# Patient Record
Sex: Female | Born: 1980 | Race: White | Hispanic: No | Marital: Married | Smoking: Current every day smoker
Health system: Southern US, Community
[De-identification: ages and names within clinical notes are randomized; demographics above are authoritative.]

## PROBLEM LIST (undated history)

## (undated) DIAGNOSIS — F419 Anxiety disorder, unspecified: Secondary | ICD-10-CM

## (undated) DIAGNOSIS — Z8619 Personal history of other infectious and parasitic diseases: Secondary | ICD-10-CM

## (undated) DIAGNOSIS — J302 Other seasonal allergic rhinitis: Secondary | ICD-10-CM

## (undated) DIAGNOSIS — F32A Depression, unspecified: Secondary | ICD-10-CM

## (undated) DIAGNOSIS — F329 Major depressive disorder, single episode, unspecified: Secondary | ICD-10-CM

## (undated) HISTORY — DX: Depression, unspecified: F32.A

## (undated) HISTORY — PX: NO PAST SURGERIES: SHX2092

## (undated) HISTORY — DX: Anxiety disorder, unspecified: F41.9

## (undated) HISTORY — DX: Personal history of other infectious and parasitic diseases: Z86.19

## (undated) HISTORY — DX: Other seasonal allergic rhinitis: J30.2

---
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
## 2001-03-16 ENCOUNTER — Inpatient Hospital Stay (HOSPITAL_COMMUNITY): Admission: AD | Admit: 2001-03-16 | Discharge: 2001-03-19 | Payer: Self-pay | Admitting: Obstetrics and Gynecology

## 2004-06-13 ENCOUNTER — Ambulatory Visit (HOSPITAL_COMMUNITY): Admission: RE | Admit: 2004-06-13 | Discharge: 2004-06-13 | Payer: Self-pay | Admitting: Obstetrics and Gynecology

## 2005-01-07 ENCOUNTER — Inpatient Hospital Stay (HOSPITAL_COMMUNITY): Admission: AD | Admit: 2005-01-07 | Discharge: 2005-01-09 | Payer: Self-pay | Admitting: Obstetrics and Gynecology

## 2005-07-16 ENCOUNTER — Other Ambulatory Visit: Admission: RE | Admit: 2005-07-16 | Discharge: 2005-07-16 | Payer: Self-pay | Admitting: Obstetrics & Gynecology

## 2007-04-28 ENCOUNTER — Other Ambulatory Visit: Admission: RE | Admit: 2007-04-28 | Discharge: 2007-04-28 | Payer: Self-pay | Admitting: Obstetrics and Gynecology

## 2008-04-29 ENCOUNTER — Other Ambulatory Visit: Admission: RE | Admit: 2008-04-29 | Discharge: 2008-04-29 | Payer: Self-pay | Admitting: Obstetrics and Gynecology

## 2010-08-06 ENCOUNTER — Other Ambulatory Visit
Admission: RE | Admit: 2010-08-06 | Discharge: 2010-08-06 | Payer: Self-pay | Source: Home / Self Care | Admitting: Obstetrics & Gynecology

## 2010-12-14 NOTE — Procedures (Signed)
NAMECYNDRA, Doris Foster                ACCOUNT NO.:  [id number]   MEDICAL RECORD NO.:  [id number]          PATIENT TYPE:  OUT   LOCATION:  RAD                           FACILITY:  [facility]   PHYSICIAN:  Dani Gobble, MD       DATE OF BIRTH:  Oct 06, 1980   DATE OF PROCEDURE:  06/13/2004  DATE OF DISCHARGE:                                  ECHOCARDIOGRAM   REFERRING PHYSICIAN:  Tilda Burrow, M.D.   INDICATIONS FOR PROCEDURE:  Doris Foster is a 30 year old female who is [redacted]  weeks pregnant who has been experiencing shortness of breath.   The technical quality of the study is adequate.   The aorta is within normal limits at 2.3 cm.   The left atrium is also within normal limits at 2.9 cm.  No obvious clots or  masses were appreciated and the patient appeared to be in sinus rhythm  during this procedure.   The intraventricular septum and posterior wall were all within normal limits  in thickness at 1.1 cm for each.   The aortic valve leaflets were not well delineated, but overall leaflet  excursion appeared to be normal.  There was no significant aortic  insufficiency noted.  Doppler interrogation of the aortic valve was within  normal limits.   The mitral valve also appeared structurally normal.  There was no mitral  valve prolapse noted.  There was trivial mitral regurgitation noted.  Doppler interrogation of the mitral valve was within normal limits.   The pulmonic valve was incompletely visualized, but appeared to be grossly  structurally normal.   The tricuspid valve was also not well visualized, but grossly structurally  normal.  Trivial tricuspid regurgitation was noted.   The left ventricle was normal in size with normal left ventricular systolic  function, no regional wall motion abnormalities noted.  There was no  evidence for diastolic dysfunction.  The right atrium was normal in size.  The right ventricle appeared somewhat generous, but with normal right  ventricular  systolic function.  Intra-atrial septum grossly appeared intact.  There was no suggestion of flow across the intra-atrial septum by color-flow  Doppler.  There was no pericardial effusion noted.   IMPRESSION:  1.  Trivial tricuspid regurgitation.  2.  The right ventricle appeared mildly generous, but with normal right      ventricular systolic function.  3.  Normal left ventricular systolic function and size without regional wall      motion abnormality noted.  4.  Essentially normal echocardiogram.      AB/MEDQ  D:  06/13/2004  T:  06/13/2004  Job:  [id number]   cc:   Tilda Burrow, M.D.  [address] [place]  [place] [address]  Fax: [phone]

## 2010-12-14 NOTE — Op Note (Signed)
Doris Foster, Doris Foster                ACCOUNT NO.:  [id number]   MEDICAL RECORD NO.:  [id number]          PATIENT TYPE:  INP   LOCATION:  LDR1                          FACILITY:  [facility]   PHYSICIAN:  Tilda Burrow, M.D. DATE OF BIRTH:  1981/02/19   DATE OF PROCEDURE:  DATE OF DISCHARGE:                                 OPERATIVE REPORT   Rosena progressed rapidly through labor. I received a page to attend to the  delivery at 12:06. Upon arriving into the room, the head was already out,  and the baby delivered in a controlled fashion by Shawna Orleans __deatheridge_,  R.N. Weight is 7 pounds and 0 ounces, Apgars are 8 and 9. Twenty units of  pitocin diluted in 1,000 cc of lactated ringers was infused rapidly IV. The  placenta separated spontaneously and delivered by a controlled cord traction  at 12:12. It was inspected and appears to be intact with a three-vessel  cord. The vagina was inspected, and no lacerations were found. She did have  what looks like a clogged sebaceous gland on the left side of her labia  minora. Five cc of 1% Xylocaine was infused, and the sebaceous gland was  opened up and drained. Estimated blood loss 200 cc.       FC/MEDQ  D:  01/08/2005  T:  01/08/2005  Job:  [id number]

## 2010-12-14 NOTE — H&P (Signed)
Doris Foster, Doris Foster                ACCOUNT NO.:  [id number]   MEDICAL RECORD NO.:  [id number]          PATIENT TYPE:  INP   LOCATION:  LDR1                          FACILITY:  [facility]   PHYSICIAN:  Lazaro Arms, M.D.   DATE OF BIRTH:  September 12, 1980   DATE OF ADMISSION:  DATE OF DISCHARGE:  LH                                HISTORY & PHYSICAL   CHIEF COMPLAINT:  Elective induction of labor on January 07, 2005.   HISTORY OF PRESENT ILLNESS:  Doris Foster is a 30 year old, gravida 3, para 1, AB1,  with an EDC of January 08, 2005 based on known last menstrual period and very  close correlating first and second trimester ultrasounds, which will place  her at [redacted] weeks gestation on her admission.  She began prenatal care early  in the first trimester and has had regular visits throughout.   PRENATAL LABORATORIES:  Blood type O positive.  Rubella immune.  HBSAG, HIV,  RPR, gonorrhea, Chlamydia, and group B strep are all negative.  Her one hour  GTT was negative at 88.  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  She was placed immediately on Valtrex, and has been on suppression  every since.  Dr. Despina Hidden checked the area a week ago, and okayed her for  vaginal delivery, barring any new outbreaks.  Total weight gain has been  about 40 pounds with appropriate fundal height growth.  Blood pressures have  been 100s to 130s over 60s to 80s.   PAST MEDICAL HISTORY:  Negative.   PAST SURGICAL HISTORY:  Negative.   ALLERGIES:  No known drug allergies.   SOCIAL HISTORY:  Married.  Denies cigarette, alcohol, or drug use.   OBSTETRICAL HISTORY:  Had an induced labor at 40-1/2 weeks in 2002 with a 3-  hour labor after artificial rupture of membranes in the morning.  Had a 7  pound, 9 ounce female infant.   FAMILY HISTORY:  Noncontributory.   PHYSICAL EXAMINATION:  HEENT:  Within normal limits.  HEART:  Regular rate and  rhythm.  LUNGS:  Clear.  ABDOMEN:  Soft, nontender.  Estimated fetal weight will be about 8 pounds.  PELVIC:  Cervical exam per [name], N.M. was 1-2 cm, 70%, -1 station.  No new lesion at the present time.  EXTREMITIES:  Legs are negative.   IMPRESSION:  Intrauterine pregnancy at 40 weeks for elective induction of  labor per request of patient.   PLAN:  Will do Foley bulb ripening Monday night with artificial rupture of  membranes and Pitocin in the morning.       FC/MEDQ  D:  01/01/2005  T:  01/01/2005  Job:  [id number]

## 2011-04-24 ENCOUNTER — Other Ambulatory Visit: Payer: Self-pay | Admitting: Adult Health

## 2011-04-24 DIAGNOSIS — N63 Unspecified lump in unspecified breast: Secondary | ICD-10-CM

## 2011-05-01 ENCOUNTER — Ambulatory Visit (HOSPITAL_COMMUNITY)
Admission: RE | Admit: 2011-05-01 | Discharge: 2011-05-01 | Disposition: A | Discharge status: Home / Self Care | Payer: 59 | Source: Ambulatory Visit | Attending: Adult Health | Admitting: Adult Health

## 2011-05-01 DIAGNOSIS — N63 Unspecified lump in unspecified breast: Secondary | ICD-10-CM

## 2011-05-01 DIAGNOSIS — N644 Mastodynia: Secondary | ICD-10-CM | POA: Insufficient documentation

## 2011-08-12 ENCOUNTER — Other Ambulatory Visit (HOSPITAL_COMMUNITY)
Admission: RE | Admit: 2011-08-12 | Discharge: 2011-08-12 | Disposition: A | Discharge status: Home / Self Care | Payer: 59 | Source: Ambulatory Visit | Attending: Obstetrics & Gynecology | Admitting: Obstetrics & Gynecology

## 2011-08-12 ENCOUNTER — Other Ambulatory Visit: Payer: Self-pay | Admitting: Obstetrics & Gynecology

## 2011-08-12 DIAGNOSIS — Z01419 Encounter for gynecological examination (general) (routine) without abnormal findings: Secondary | ICD-10-CM | POA: Insufficient documentation

## 2013-07-05 ENCOUNTER — Ambulatory Visit (INDEPENDENT_AMBULATORY_CARE_PROVIDER_SITE_OTHER): Payer: 59 | Admitting: Family Medicine

## 2013-07-05 ENCOUNTER — Encounter: Payer: Self-pay | Admitting: Family Medicine

## 2013-07-05 VITALS — BP 110/72 | Temp 100.9°F | Ht 65.0 in | Wt 173.0 lb

## 2013-07-05 DIAGNOSIS — A6000 Herpesviral infection of urogenital system, unspecified: Secondary | ICD-10-CM

## 2013-07-05 DIAGNOSIS — J019 Acute sinusitis, unspecified: Secondary | ICD-10-CM

## 2013-07-05 MED ORDER — LEVOFLOXACIN 500 MG PO TABS: 500.0000 mg | ORAL_TABLET | Freq: Every day | ORAL | Status: AC

## 2013-07-05 MED ORDER — VALACYCLOVIR HCL 500 MG PO TABS: 500.0000 mg | ORAL_TABLET | Freq: Two times a day (BID) | ORAL | Status: AC

## 2013-07-05 NOTE — Progress Notes (Signed)
   Subjective:    Patient ID: Doris Foster, female    DOB: [birth year], 32 y.o.   MRN: [id number]  Cough This is a new problem. The current episode started in the past 7 days. Associated symptoms include a fever, headaches and myalgias.   Herpes outbreak. Would like to get RX for valtrex. Her rx has expired.  Cough throughout the night, sweats, chills PMH-benign  Review of Systems  Constitutional: Positive for fever.  Respiratory: Positive for cough.   Musculoskeletal: Positive for myalgias.  Neurological: Positive for headaches.       Objective:   Physical Exam  Nursing note and vitals reviewed. Constitutional: She appears well-developed.  HENT:  Head: Normocephalic.  Nose: Nose normal.  Mouth/Throat: Oropharynx is clear and moist. No oropharyngeal exudate.  Neck: Neck supple.  Cardiovascular: Normal rate and normal heart sounds.   No murmur heard. Pulmonary/Chest: Effort normal and breath sounds normal. She has no wheezes.  Lymphadenopathy:    She has no cervical adenopathy.  Skin: Skin is warm and dry.          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

## 2013-07-08 ENCOUNTER — Telehealth: Payer: Self-pay | Admitting: Family Medicine

## 2013-07-08 NOTE — Telephone Encounter (Signed)
Pt seen 12/8 an given levaquin, states she does not feel any better as you stated she would Within 2-3 days. Wants to know if she needs anything else called in or give it more time?   Symptoms,  SOB, cough, head pressure, congestion, watery eyes, chest hurts from cough  [facility]

## 2013-07-08 NOTE — Telephone Encounter (Signed)
NTC- more indepth review plz

## 2013-07-08 NOTE — Telephone Encounter (Signed)
LMRC 12/11 

## 2013-07-08 NOTE — Telephone Encounter (Signed)
Spoke with patient. She is going to call us back tomorrow morning with an update on how she is feeling. (Dr. Lorin Picket said to give it another 24hrs for the Levaquin to work.)

## 2013-07-09 NOTE — Telephone Encounter (Signed)
Patient states she is feeling better and will cont the antibiotics. She said she will call back if she feels worst.

## 2013-08-09 ENCOUNTER — Other Ambulatory Visit: Payer: Self-pay | Admitting: *Deleted

## 2013-08-09 MED ORDER — FLUCONAZOLE 150 MG PO TABS: 150.0000 mg | ORAL_TABLET | Freq: Every day | ORAL | Status: DC

## 2013-08-23 ENCOUNTER — Other Ambulatory Visit: Payer: 59 | Admitting: Obstetrics & Gynecology

## 2013-09-03 ENCOUNTER — Encounter: Payer: Self-pay | Admitting: Obstetrics & Gynecology

## 2013-09-03 ENCOUNTER — Ambulatory Visit (INDEPENDENT_AMBULATORY_CARE_PROVIDER_SITE_OTHER): Payer: 59 | Admitting: Obstetrics & Gynecology

## 2013-09-03 ENCOUNTER — Encounter (INDEPENDENT_AMBULATORY_CARE_PROVIDER_SITE_OTHER): Payer: Self-pay

## 2013-09-03 ENCOUNTER — Other Ambulatory Visit (HOSPITAL_COMMUNITY)
Admission: RE | Admit: 2013-09-03 | Discharge: 2013-09-03 | Disposition: A | Discharge status: Home / Self Care | Payer: 59 | Source: Ambulatory Visit | Attending: Obstetrics & Gynecology | Admitting: Obstetrics & Gynecology

## 2013-09-03 VITALS — BP 110/80 | Ht 64.0 in | Wt 174.0 lb

## 2013-09-03 DIAGNOSIS — Z1151 Encounter for screening for human papillomavirus (HPV): Secondary | ICD-10-CM | POA: Insufficient documentation

## 2013-09-03 DIAGNOSIS — Z01419 Encounter for gynecological examination (general) (routine) without abnormal findings: Secondary | ICD-10-CM

## 2013-09-03 NOTE — Progress Notes (Signed)
Patient ID: Doris Foster, female   DOB: 1981/06/26, 33 y.o.   MRN: [id number] Subjective:     Doris Foster is a 33 y.o. female here for a routine exam.  Patient's last menstrual period was 08/17/2013. No obstetric history on file. Birth Control Method:  condoms Menstrual Calendar(currently): regular  Current complaints: none.   Current acute medical issues:  none   Recent Gynecologic History Patient's last menstrual period was 08/17/2013. Last Pap: 2013,  normal Last mammogram: na,    History reviewed. No pertinent past medical history.  No past surgical history on file.  OB History   Grav Para Term Preterm Abortions TAB SAB Ect Mult Living                  History   Social History  . Marital Status: Married    Spouse Name: N/A    Number of Children: N/A  . Years of Education: N/A   Social History Main Topics  . Smoking status: Never Smoker   . Smokeless tobacco: None  . Alcohol Use: None  . Drug Use: None  . Sexual Activity: None   Other Topics Concern  . None   Social History Narrative  . None    History reviewed. No pertinent family history.   Review of Systems  Review of Systems  Constitutional: Negative for fever, chills, weight loss, malaise/fatigue and diaphoresis.  HENT: Negative for hearing loss, ear pain, nosebleeds, congestion, sore throat, neck pain, tinnitus and ear discharge.   Eyes: Negative for blurred vision, double vision, photophobia, pain, discharge and redness.  Respiratory: Negative for cough, hemoptysis, sputum production, shortness of breath, wheezing and stridor.   Cardiovascular: Negative for chest pain, palpitations, orthopnea, claudication, leg swelling and PND.  Gastrointestinal: negative for abdominal pain. Negative for heartburn, nausea, vomiting, diarrhea, constipation, blood in stool and melena.  Genitourinary: Negative for dysuria, urgency, frequency, hematuria and flank pain.  Musculoskeletal: Negative for  myalgias, back pain, joint pain and falls.  Skin: Negative for itching and rash.  Neurological: Negative for dizziness, tingling, tremors, sensory change, speech change, focal weakness, seizures, loss of consciousness, weakness and headaches.  Endo/Heme/Allergies: Negative for environmental allergies and polydipsia. Does not bruise/bleed easily.  Psychiatric/Behavioral: Negative for depression, suicidal ideas, hallucinations, memory loss and substance abuse. The patient is not nervous/anxious and does not have insomnia.        Objective:    Physical Exam  Vitals reviewed. Constitutional: She is oriented to person, place, and time. She appears well-developed and well-nourished.  HENT:  Head: Normocephalic and atraumatic.        Right Ear: External ear normal.  Left Ear: External ear normal.  Nose: Nose normal.  Mouth/Throat: Oropharynx is clear and moist.  Eyes: Conjunctivae and EOM are normal. Pupils are equal, round, and reactive to light. Right eye exhibits no discharge. Left eye exhibits no discharge. No scleral icterus.  Neck: Normal range of motion. Neck supple. No tracheal deviation present. No thyromegaly present.  Cardiovascular: Normal rate, regular rhythm, normal heart sounds and intact distal pulses.  Exam reveals no gallop and no friction rub.   No murmur heard. Respiratory: Effort normal and breath sounds normal. No respiratory distress. She has no wheezes. She has no rales. She exhibits no tenderness.  GI: Soft. Bowel sounds are normal. She exhibits no distension and no mass. There is no tenderness. There is no rebound and no guarding.  Genitourinary:  Breasts no masses skin changes or nipple changes bilaterally  Vulva is normal without lesions Vagina is pink moist without discharge Cervix normal in appearance and pap is done Uterus is normal size shape and contour Adnexa is negative with normal sized ovaries   Musculoskeletal: Normal range of motion. She exhibits  no edema and no tenderness.  Neurological: She is alert and oriented to person, place, and time. She has normal reflexes. She displays normal reflexes. No cranial nerve deficit. She exhibits normal muscle tone. Coordination normal.  Skin: Skin is warm and dry. No rash noted. No erythema. No pallor.  Psychiatric: She has a normal mood and affect. Her behavior is normal. Judgment and thought content normal.       Assessment:    Healthy female exam.    Plan:    Follow up in: 1 year.

## 2014-09-05 ENCOUNTER — Encounter: Payer: Self-pay | Admitting: Obstetrics & Gynecology

## 2014-09-05 ENCOUNTER — Other Ambulatory Visit (HOSPITAL_COMMUNITY)
Admission: RE | Admit: 2014-09-05 | Discharge: 2014-09-05 | Disposition: A | Discharge status: Home / Self Care | Payer: 59 | Source: Ambulatory Visit | Attending: Obstetrics & Gynecology | Admitting: Obstetrics & Gynecology

## 2014-09-05 ENCOUNTER — Ambulatory Visit (INDEPENDENT_AMBULATORY_CARE_PROVIDER_SITE_OTHER): Payer: 59 | Admitting: Obstetrics & Gynecology

## 2014-09-05 VITALS — BP 100/70 | Ht 65.0 in | Wt 183.0 lb

## 2014-09-05 DIAGNOSIS — Z01419 Encounter for gynecological examination (general) (routine) without abnormal findings: Secondary | ICD-10-CM | POA: Insufficient documentation

## 2014-09-05 MED ORDER — ETONOGESTREL-ETHINYL ESTRADIOL 0.12-0.015 MG/24HR VA RING: VAGINAL_RING | VAGINAL | Status: DC

## 2014-09-05 NOTE — Progress Notes (Signed)
Patient ID: Doris Foster, female   DOB: Oct 02, 1980, 34 y.o.   MRN: [id number] Subjective:     Doris Foster is a 34 y.o. female here for a routine exam.  Patient's last menstrual period was 08/25/2014. No obstetric history on file. Birth Control Method:  condoms Menstrual Calendar(currently): regular, getting a little heavier  Current complaints: none.   Current acute medical issues:  none   Recent Gynecologic History Patient's last menstrual period was 08/25/2014. Last Pap: 2015,  normal Last mammogram: ,    History reviewed. No pertinent past medical history.  History reviewed. No pertinent past surgical history.  OB History    No data available      History   Social History  . Marital Status: Married    Spouse Name: N/A    Number of Children: N/A  . Years of Education: N/A   Social History Main Topics  . Smoking status: Never Smoker   . Smokeless tobacco: None  . Alcohol Use: None  . Drug Use: None  . Sexual Activity: None   Other Topics Concern  . None   Social History Narrative    History reviewed. No pertinent family history.   Current outpatient prescriptions:  .  fluconazole (DIFLUCAN) 150 MG tablet, Take 1 tablet (150 mg total) by mouth daily., Disp: 1 tablet, Rfl: 1  Review of Systems  Review of Systems  Constitutional: Negative for fever, chills, weight loss, malaise/fatigue and diaphoresis.  HENT: Negative for hearing loss, ear pain, nosebleeds, congestion, sore throat, neck pain, tinnitus and ear discharge.   Eyes: Negative for blurred vision, double vision, photophobia, pain, discharge and redness.  Respiratory: Negative for cough, hemoptysis, sputum production, shortness of breath, wheezing and stridor.   Cardiovascular: Negative for chest pain, palpitations, orthopnea, claudication, leg swelling and PND.  Gastrointestinal: negative for abdominal pain. Negative for heartburn, nausea, vomiting, diarrhea, constipation, blood in stool  and melena.  Genitourinary: Negative for dysuria, urgency, frequency, hematuria and flank pain.  Musculoskeletal: Negative for myalgias, back pain, joint pain and falls.  Skin: Negative for itching and rash.  Neurological: Negative for dizziness, tingling, tremors, sensory change, speech change, focal weakness, seizures, loss of consciousness, weakness and headaches.  Endo/Heme/Allergies: Negative for environmental allergies and polydipsia. Does not bruise/bleed easily.  Psychiatric/Behavioral: Negative for depression, suicidal ideas, hallucinations, memory loss and substance abuse. The patient is not nervous/anxious and does not have insomnia.        Objective:  Blood pressure 100/70, height 5\' 5"  (1.651 m), weight 183 lb (83.008 kg), last menstrual period 08/25/2014.   Physical Exam  Vitals reviewed. Constitutional: She is oriented to person, place, and time. She appears well-developed and well-nourished.  HENT:  Head: Normocephalic and atraumatic.        Right Ear: External ear normal.  Left Ear: External ear normal.  Nose: Nose normal.  Mouth/Throat: Oropharynx is clear and moist.  Eyes: Conjunctivae and EOM are normal. Pupils are equal, round, and reactive to light. Right eye exhibits no discharge. Left eye exhibits no discharge. No scleral icterus.  Neck: Normal range of motion. Neck supple. No tracheal deviation present. No thyromegaly present.  Cardiovascular: Normal rate, regular rhythm, normal heart sounds and intact distal pulses.  Exam reveals no gallop and no friction rub.   No murmur heard. Respiratory: Effort normal and breath sounds normal. No respiratory distress. She has no wheezes. She has no rales. She exhibits no tenderness.  GI: Soft. Bowel sounds are normal. She exhibits no distension  and no mass. There is no tenderness. There is no rebound and no guarding.  Genitourinary:  Breasts no masses skin changes or nipple changes bilaterally      Vulva is normal without  lesions Vagina is pink moist without discharge Cervix normal in appearance and pap is done Uterus is normal size shape and contour Adnexa is negative with normal sized ovaries    Musculoskeletal: Normal range of motion. She exhibits no edema and no tenderness.  Neurological: She is alert and oriented to person, place, and time. She has normal reflexes. She displays normal reflexes. No cranial nerve deficit. She exhibits normal muscle tone. Coordination normal.  Skin: Skin is warm and dry. No rash noted. No erythema. No pallor.  Psychiatric: She has a normal mood and affect. Her behavior is normal. Judgment and thought content normal.       Assessment:    Healthy female exam.    Plan:    Contraception: condoms. Follow up in: 1 year.

## 2014-09-09 LAB — CYTOLOGY - PAP

## 2014-10-17 ENCOUNTER — Telehealth: Payer: Self-pay | Admitting: Obstetrics & Gynecology

## 2014-10-17 MED ORDER — VALACYCLOVIR HCL 1 G PO TABS: 1000.0000 mg | ORAL_TABLET | Freq: Two times a day (BID) | ORAL | Status: DC

## 2014-10-17 NOTE — Telephone Encounter (Signed)
Left message letting pt know Dr. Despina HiddenEure had sent rx to pharmacy. JSY

## 2014-10-17 NOTE — Telephone Encounter (Signed)
Spoke with pt. Pt is requesting Valtrex. She is having an outbreak, it started 2-3 days ago. Please advise. Thanks!! JSY

## 2015-07-03 ENCOUNTER — Encounter: Payer: Self-pay | Admitting: Family Medicine

## 2015-07-03 ENCOUNTER — Ambulatory Visit (INDEPENDENT_AMBULATORY_CARE_PROVIDER_SITE_OTHER): Payer: 59 | Admitting: Family Medicine

## 2015-07-03 VITALS — Temp 98.6°F | Ht 65.0 in | Wt 189.2 lb

## 2015-07-03 DIAGNOSIS — J019 Acute sinusitis, unspecified: Secondary | ICD-10-CM | POA: Diagnosis not present

## 2015-07-03 DIAGNOSIS — B9789 Other viral agents as the cause of diseases classified elsewhere: Secondary | ICD-10-CM

## 2015-07-03 DIAGNOSIS — J988 Other specified respiratory disorders: Secondary | ICD-10-CM

## 2015-07-03 DIAGNOSIS — B349 Viral infection, unspecified: Secondary | ICD-10-CM | POA: Diagnosis not present

## 2015-07-03 DIAGNOSIS — B9689 Other specified bacterial agents as the cause of diseases classified elsewhere: Secondary | ICD-10-CM

## 2015-07-03 MED ORDER — AMOXICILLIN 500 MG PO TABS: 500.0000 mg | ORAL_TABLET | Freq: Three times a day (TID) | ORAL | Status: DC

## 2015-07-03 NOTE — Progress Notes (Signed)
   Subjective:    Patient ID: Doris Foster, female    DOB: 1981/03/04, 34 y.o.   MRN: [id number]  Cough This is a new problem. The current episode started in the past 7 days. Associated symptoms include ear pain, headaches, nasal congestion, rhinorrhea and shortness of breath. Pertinent negatives include no chest pain, fever or wheezing. Treatments tried: tylenol cold, mucinex, motrin.   Patient with multiple days of head congestion drainage coughing not feeling good symptoms worse over the past few days with sinus pressure   Review of Systems  Constitutional: Negative for fever and activity change.  HENT: Positive for congestion, ear pain and rhinorrhea.   Eyes: Negative for discharge.  Respiratory: Positive for cough and shortness of breath. Negative for wheezing.   Cardiovascular: Negative for chest pain.  Neurological: Positive for headaches.       Objective:   Physical Exam  Constitutional: She appears well-developed.  HENT:  Head: Normocephalic.  Nose: Nose normal.  Mouth/Throat: Oropharynx is clear and moist. No oropharyngeal exudate.  Neck: Neck supple.  Cardiovascular: Normal rate and normal heart sounds.   No murmur heard. Pulmonary/Chest: Effort normal and breath sounds normal. She has no wheezes.  Lymphadenopathy:    She has no cervical adenopathy.  Skin: Skin is warm and dry.  Nursing note and vitals reviewed.   I believe patient had moderate viral illness that triggered into a sinus infection no evidence of pneumonia      Assessment & Plan:  Severe sinusitis antibiotics prescribed warning signs discussed follow-up if problems

## 2015-09-07 ENCOUNTER — Ambulatory Visit (INDEPENDENT_AMBULATORY_CARE_PROVIDER_SITE_OTHER): Payer: 59 | Admitting: Advanced Practice Midwife

## 2015-09-07 ENCOUNTER — Encounter: Payer: Self-pay | Admitting: Advanced Practice Midwife

## 2015-09-07 ENCOUNTER — Other Ambulatory Visit (HOSPITAL_COMMUNITY)
Admission: RE | Admit: 2015-09-07 | Discharge: 2015-09-07 | Disposition: A | Discharge status: Home / Self Care | Payer: 59 | Source: Ambulatory Visit | Attending: Advanced Practice Midwife | Admitting: Advanced Practice Midwife

## 2015-09-07 VITALS — BP 126/82 | HR 88 | Ht 65.0 in | Wt 191.0 lb

## 2015-09-07 DIAGNOSIS — Z1151 Encounter for screening for human papillomavirus (HPV): Secondary | ICD-10-CM | POA: Insufficient documentation

## 2015-09-07 DIAGNOSIS — Z01419 Encounter for gynecological examination (general) (routine) without abnormal findings: Secondary | ICD-10-CM

## 2015-09-07 DIAGNOSIS — Z1329 Encounter for screening for other suspected endocrine disorder: Secondary | ICD-10-CM

## 2015-09-07 DIAGNOSIS — Z13220 Encounter for screening for lipoid disorders: Secondary | ICD-10-CM

## 2015-09-07 DIAGNOSIS — Z131 Encounter for screening for diabetes mellitus: Secondary | ICD-10-CM

## 2015-09-07 MED ORDER — VALACYCLOVIR HCL 1 G PO TABS: 1000.0000 mg | ORAL_TABLET | Freq: Two times a day (BID) | ORAL | Status: DC

## 2015-09-07 NOTE — Progress Notes (Signed)
Doris Foster 35 y.o.  Filed Vitals:   09/07/15 0842  BP: 126/82  Pulse: 88     Past Medical History: No past medical history on file.  Past Surgical History: No past surgical history on file.  Family History: No family history on file.  Social History: Social History  Substance Use Topics  . Smoking status: Never Smoker   . Smokeless tobacco: Not on file  . Alcohol Use: Not on file    Allergies: No Known Allergies    Current outpatient prescriptions:  .  FLUTICASONE PROPIONATE, NASAL, NA, Place 2 sprays into the nose daily., Disp: , Rfl:  .  valACYclovir (VALTREX) 1000 MG tablet, Take 1 tablet (1,000 mg total) by mouth 2 (two) times daily. (Patient not taking: Reported on 09/07/2015), Disp: 20 tablet, Rfl: 11  History of Present Illness: Here for annual exam.  Has been getting yearly paps for years (skipped 2014) that are normal and has the same partner.  Never advised of new recommendations. Had ASCUS "a long time ago".  Uses condoms and is happy with that method.  Advised of Plan B prn.    Review of Systems   Patient denies any headaches, blurred vision, shortness of breath, chest pain, abdominal pain, problems with bowel movements, urination, or intercourse.   Physical Exam: General:  Well developed, well nourished, no acute distress Skin:  Warm and dry Neck:  Midline trachea, normal thyroid Lungs; Clear to auscultation bilaterally Breast:  No dominant palpable mass, retraction, or nipple discharge Cardiovascular: Regular rate and rhythm Abdomen:  Soft, non tender, no hepatosplenomegaly Pelvic:  External genitalia is normal in appearance.  The vagina is normal in appearance.  The cervix is bulbous.  Uterus is felt to be normal size, shape, and contour.  No adnexal masses or tenderness noted.  Extremities:  No swelling or varicosities noted Psych:  No mood changes.     Impression: normal GYN exam     Plan: if pap is normal, may go to q 3 years if she  wants Ordered fasting screening labs (has never had them).

## 2015-09-10 LAB — COMPREHENSIVE METABOLIC PANEL
ALBUMIN: 4.5 g/dL (ref 3.5–5.5)
ALT: 22 IU/L (ref 0–32)
AST: 20 IU/L (ref 0–40)
Albumin/Globulin Ratio: 1.8 (ref 1.1–2.5)
Alkaline Phosphatase: 117 IU/L (ref 39–117)
BILIRUBIN TOTAL: 0.4 mg/dL (ref 0.0–1.2)
BUN / CREAT RATIO: 14 (ref 8–20)
BUN: 10 mg/dL (ref 6–20)
CO2: 25 mmol/L (ref 18–29)
CREATININE: 0.72 mg/dL (ref 0.57–1.00)
Calcium: 9.8 mg/dL (ref 8.7–10.2)
Chloride: 100 mmol/L (ref 96–106)
GFR calc Af Amer: 126 mL/min/{1.73_m2} (ref >59)
GFR calc non Af Amer: 110 mL/min/{1.73_m2} (ref >59)
GLOBULIN, TOTAL: 2.5 g/dL (ref 1.5–4.5)
Glucose: 100 mg/dL — ABNORMAL HIGH (ref 65–99)
Potassium: 4.8 mmol/L (ref 3.5–5.2)
SODIUM: 141 mmol/L (ref 134–144)
Total Protein: 7 g/dL (ref 6.0–8.5)

## 2015-09-10 LAB — LIPID PANEL
CHOLESTEROL TOTAL: 155 mg/dL (ref 100–199)
Chol/HDL Ratio: 3.2 ratio units (ref 0.0–4.4)
HDL: 48 mg/dL (ref >39)
LDL Calculated: 93 mg/dL (ref 0–99)
Triglycerides: 69 mg/dL (ref 0–149)
VLDL Cholesterol Cal: 14 mg/dL (ref 5–40)

## 2015-09-10 LAB — CBC
Hematocrit: 38.7 % (ref 34.0–46.6)
Hemoglobin: 13.2 g/dL (ref 11.1–15.9)
MCH: 31.8 pg (ref 26.6–33.0)
MCHC: 34.1 g/dL (ref 31.5–35.7)
MCV: 93 fL (ref 79–97)
PLATELETS: 330 10*3/uL (ref 150–379)
RBC: 4.15 x10E6/uL (ref 3.77–5.28)
RDW: 12.7 % (ref 12.3–15.4)
WBC: 6.9 10*3/uL (ref 3.4–10.8)

## 2015-09-10 LAB — TSH: TSH: 0.581 u[IU]/mL (ref 0.450–4.500)

## 2015-09-11 LAB — CYTOLOGY - PAP

## 2015-10-27 ENCOUNTER — Telehealth: Payer: Self-pay | Admitting: Family Medicine

## 2015-10-27 MED ORDER — OSELTAMIVIR PHOSPHATE 75 MG PO CAPS: 75.0000 mg | ORAL_CAPSULE | Freq: Two times a day (BID) | ORAL | Status: DC

## 2015-10-27 NOTE — Telephone Encounter (Signed)
Tamiflu 75 twice a day 5 days

## 2015-10-27 NOTE — Telephone Encounter (Signed)
Patient is having headaches, sore throat, body aches and coughing.

## 2015-10-27 NOTE — Telephone Encounter (Signed)
Patient notified med was sent to pharmacy.  

## 2015-10-27 NOTE — Telephone Encounter (Signed)
Patient's son was seen for flu on 3/22 and now she has it can you call in tamiflu into [facility].

## 2016-06-21 ENCOUNTER — Encounter: Payer: Self-pay | Admitting: Family Medicine

## 2016-06-21 ENCOUNTER — Ambulatory Visit (INDEPENDENT_AMBULATORY_CARE_PROVIDER_SITE_OTHER): Payer: 59 | Admitting: Family Medicine

## 2016-06-21 VITALS — BP 126/74 | Temp 99.1°F | Ht 65.0 in | Wt 196.0 lb

## 2016-06-21 DIAGNOSIS — J019 Acute sinusitis, unspecified: Secondary | ICD-10-CM | POA: Diagnosis not present

## 2016-06-21 DIAGNOSIS — B9689 Other specified bacterial agents as the cause of diseases classified elsewhere: Secondary | ICD-10-CM | POA: Diagnosis not present

## 2016-06-21 MED ORDER — AMOXICILLIN-POT CLAVULANATE 875-125 MG PO TABS: 1.0000 | ORAL_TABLET | Freq: Two times a day (BID) | ORAL | 0 refills | Status: AC

## 2016-06-21 NOTE — Progress Notes (Signed)
   Subjective:    Patient ID: Doris Foster, female    DOB: 06/02/1981, 35 y.o.   MRN: [id number]  Sinusitis  This is a new problem. Episode onset: 4 days. Associated symptoms include congestion, coughing, headaches, shortness of breath and a sore throat. (Dizzy, achy, ) Treatments tried: flonase, mucinex, tylenol cold and flu.   Started Tuesday  First noticed a cold, and then got a runny nose and headache  Had exp to cold   tok zysal , and flonase, and took tyl,  No sig fever low gr only  Frontal chead ache bad  Cough prod , takes muc dm once per dy   Review of Systems  HENT: Positive for congestion and sore throat.   Respiratory: Positive for cough and shortness of breath.   Neurological: Positive for headaches.       Objective:   Physical Exam Alert, mild malaise. Hydration good Vitals stable. frontal/ maxillary tenderness evident positive nasal congestion. pharynx normal neck supple  lungs clear/no crackles or wheezes. heart regular in rhythm        Assessment & Plan:  Impression rhinosinusitis likely post viral, discussed with patient. plan antibiotics prescribed. Questions answered. Symptomatic care discussed. warning signs discussed. WSL

## 2016-06-28 ENCOUNTER — Telehealth: Payer: Self-pay | Admitting: Family Medicine

## 2016-06-28 NOTE — Telephone Encounter (Signed)
Patient is [facility] be [facility]

## 2016-06-28 NOTE — Telephone Encounter (Signed)
Tried to call no answer 06/28/16

## 2016-07-01 MED ORDER — FLUCONAZOLE 150 MG PO TABS: ORAL_TABLET | ORAL | 0 refills | Status: DC

## 2016-07-01 NOTE — Telephone Encounter (Signed)
Patient called back stating that she has vaginal itching, and burning and is post antibiotics. Per protocol Diflucan 150 mg 1 tablet by mouth 3 days apart will be sent into pharmacy.

## 2016-09-14 ENCOUNTER — Other Ambulatory Visit: Payer: Self-pay | Admitting: Advanced Practice Midwife

## 2017-02-24 ENCOUNTER — Encounter: Payer: Self-pay | Admitting: Nurse Practitioner

## 2017-02-24 ENCOUNTER — Encounter: Payer: Self-pay | Admitting: Family Medicine

## 2017-02-24 ENCOUNTER — Ambulatory Visit (INDEPENDENT_AMBULATORY_CARE_PROVIDER_SITE_OTHER): Payer: 59 | Admitting: Nurse Practitioner

## 2017-02-24 VITALS — BP 122/84 | Ht 65.0 in | Wt 206.0 lb

## 2017-02-24 DIAGNOSIS — Z0000 Encounter for general adult medical examination without abnormal findings: Secondary | ICD-10-CM

## 2017-02-24 DIAGNOSIS — Z79899 Other long term (current) drug therapy: Secondary | ICD-10-CM

## 2017-02-24 DIAGNOSIS — R5383 Other fatigue: Secondary | ICD-10-CM

## 2017-02-24 DIAGNOSIS — Z13220 Encounter for screening for lipoid disorders: Secondary | ICD-10-CM | POA: Diagnosis not present

## 2017-02-24 MED ORDER — FLUCONAZOLE 150 MG PO TABS: ORAL_TABLET | ORAL | 0 refills | Status: DC

## 2017-02-24 MED ORDER — VALACYCLOVIR HCL 1 G PO TABS: ORAL_TABLET | ORAL | 2 refills | Status: DC

## 2017-02-24 NOTE — Progress Notes (Signed)
   Subjective:    Patient ID: Doris Foster, female    DOB: 24-Mar-1981, 36 y.o.   MRN: [id number]  HPI presents for her wellness exam. Defers pelvic exam. Started menses yesterday. Has normal PAP with neg HPV last year. Uses condoms for birth control. Married, same sex partner. Regular cycles, slightly heavy flow. Regular dental exams. Needs vision exam. Has started smoking. A pack will last about a week. No outbreaks of herpes lately.     Review of Systems  Constitutional: Positive for fatigue and unexpected weight change. Negative for activity change and appetite change.  HENT: Negative for dental problem, ear pain, sinus pressure and sore throat.   Respiratory: Negative for cough, chest tightness, shortness of breath and wheezing.        Snoring.  Cardiovascular: Negative for chest pain.  Gastrointestinal: Positive for diarrhea. Negative for abdominal distention, abdominal pain, constipation, nausea and vomiting.       Occasional mild diarrhea with cramping.   Genitourinary: Negative for difficulty urinating, dysuria, enuresis, frequency, genital sores, menstrual problem, pelvic pain, urgency and vaginal discharge.       Objective:   Physical Exam  Constitutional: She is oriented to person, place, and time. She appears well-developed. No distress.  HENT:  Right Ear: External ear normal.  Left Ear: External ear normal.  Mouth/Throat: Oropharynx is clear and moist.  Neck: Normal range of motion. Neck supple. No tracheal deviation present. No thyromegaly present.  Cardiovascular: Normal rate, regular rhythm and normal heart sounds.  Exam reveals no gallop.   No murmur heard. Pulmonary/Chest: Effort normal and breath sounds normal.  Abdominal: Soft. She exhibits no distension. There is no tenderness.  Genitourinary:  Genitourinary Comments: Defers GU exam due to menses.   Musculoskeletal: She exhibits no edema.  Lymphadenopathy:    She has no cervical adenopathy.    Neurological: She is alert and oriented to person, place, and time.  Skin: Skin is warm and dry. No rash noted.  Psychiatric: She has a normal mood and affect. Her behavior is normal.  Vitals reviewed. Breast exam: no masses; areas of dense tissue; axillae no adenopathy.        Assessment & Plan:  Annual physical exam  Fatigue, unspecified type - Plan: CBC with Differential/Platelet, Basic metabolic panel, VITAMIN D 25 Hydroxy (Vit-D Deficiency, Fractures), TSH  Screening for lipid disorders - Plan: Lipid panel  High risk medication use - Plan: Hepatic function panel  Given a copy of [place] questionnaire for her to complete. Labs pending. Recommend regular activity and weight loss. Discussed smoking cessation.  Return in about 1 year (around 02/24/2018) for physical.

## 2017-03-03 LAB — CBC WITH DIFFERENTIAL/PLATELET
BASOS: 0 %
Basophils Absolute: 0 10*3/uL (ref 0.0–0.2)
EOS (ABSOLUTE): 0.1 10*3/uL (ref 0.0–0.4)
EOS: 1 %
Hematocrit: 39.1 % (ref 34.0–46.6)
Hemoglobin: 12.7 g/dL (ref 11.1–15.9)
IMMATURE GRANS (ABS): 0 10*3/uL (ref 0.0–0.1)
IMMATURE GRANULOCYTES: 0 %
LYMPHS: 26 %
Lymphocytes Absolute: 1.8 10*3/uL (ref 0.7–3.1)
MCH: 30.5 pg (ref 26.6–33.0)
MCHC: 32.5 g/dL (ref 31.5–35.7)
MCV: 94 fL (ref 79–97)
Monocytes Absolute: 0.4 10*3/uL (ref 0.1–0.9)
Monocytes: 6 %
NEUTROS PCT: 67 %
Neutrophils Absolute: 4.7 10*3/uL (ref 1.4–7.0)
PLATELETS: 313 10*3/uL (ref 150–379)
RBC: 4.17 x10E6/uL (ref 3.77–5.28)
RDW: 12.6 % (ref 12.3–15.4)
WBC: 7 10*3/uL (ref 3.4–10.8)

## 2017-03-03 LAB — LIPID PANEL
CHOLESTEROL TOTAL: 136 mg/dL (ref 100–199)
Chol/HDL Ratio: 2.5 ratio (ref 0.0–4.4)
HDL: 54 mg/dL (ref >39)
LDL Calculated: 65 mg/dL (ref 0–99)
TRIGLYCERIDES: 84 mg/dL (ref 0–149)
VLDL CHOLESTEROL CAL: 17 mg/dL (ref 5–40)

## 2017-03-03 LAB — BASIC METABOLIC PANEL
BUN/Creatinine Ratio: 20 (ref 9–23)
BUN: 14 mg/dL (ref 6–20)
CALCIUM: 9.4 mg/dL (ref 8.7–10.2)
CHLORIDE: 103 mmol/L (ref 96–106)
CO2: 21 mmol/L (ref 20–29)
Creatinine, Ser: 0.7 mg/dL (ref 0.57–1.00)
GFR calc non Af Amer: 113 mL/min/{1.73_m2} (ref >59)
GFR, EST AFRICAN AMERICAN: 130 mL/min/{1.73_m2} (ref >59)
Glucose: 89 mg/dL (ref 65–99)
Potassium: 4.2 mmol/L (ref 3.5–5.2)
Sodium: 142 mmol/L (ref 134–144)

## 2017-03-03 LAB — VITAMIN D 25 HYDROXY (VIT D DEFICIENCY, FRACTURES): VIT D 25 HYDROXY: 43.5 ng/mL (ref 30.0–100.0)

## 2017-03-03 LAB — HEPATIC FUNCTION PANEL
ALK PHOS: 159 IU/L — AB (ref 39–117)
ALT: 54 IU/L — AB (ref 0–32)
AST: 37 IU/L (ref 0–40)
Albumin: 4.6 g/dL (ref 3.5–5.5)
BILIRUBIN, DIRECT: 0.12 mg/dL (ref 0.00–0.40)
Bilirubin Total: 0.4 mg/dL (ref 0.0–1.2)
Total Protein: 7.5 g/dL (ref 6.0–8.5)

## 2017-03-03 LAB — TSH: TSH: 1.32 u[IU]/mL (ref 0.450–4.500)

## 2017-03-05 ENCOUNTER — Other Ambulatory Visit: Payer: Self-pay | Admitting: Nurse Practitioner

## 2017-03-05 DIAGNOSIS — R945 Abnormal results of liver function studies: Secondary | ICD-10-CM

## 2017-03-05 DIAGNOSIS — R7989 Other specified abnormal findings of blood chemistry: Secondary | ICD-10-CM

## 2017-03-29 LAB — HEPATIC FUNCTION PANEL
ALBUMIN: 4.5 g/dL (ref 3.5–5.5)
ALT: 27 IU/L (ref 0–32)
AST: 19 IU/L (ref 0–40)
Alkaline Phosphatase: 124 IU/L — ABNORMAL HIGH (ref 39–117)
BILIRUBIN TOTAL: 0.3 mg/dL (ref 0.0–1.2)
BILIRUBIN, DIRECT: 0.1 mg/dL (ref 0.00–0.40)
Total Protein: 7 g/dL (ref 6.0–8.5)

## 2017-05-20 ENCOUNTER — Ambulatory Visit (INDEPENDENT_AMBULATORY_CARE_PROVIDER_SITE_OTHER): Payer: 59 | Admitting: Orthopaedic Surgery

## 2017-05-20 ENCOUNTER — Encounter: Payer: Self-pay | Admitting: Orthopaedic Surgery

## 2017-05-20 ENCOUNTER — Ambulatory Visit (INDEPENDENT_AMBULATORY_CARE_PROVIDER_SITE_OTHER): Payer: 59

## 2017-05-20 VITALS — BP 133/88 | HR 75 | Temp 98.0°F | Ht 65.0 in | Wt 206.0 lb

## 2017-05-20 DIAGNOSIS — M25562 Pain in left knee: Secondary | ICD-10-CM

## 2017-05-20 DIAGNOSIS — G8929 Other chronic pain: Secondary | ICD-10-CM

## 2017-05-20 MED ORDER — HYDROCODONE-ACETAMINOPHEN 5-325 MG PO TABS: ORAL_TABLET | ORAL | 0 refills | Status: DC

## 2017-05-20 MED ORDER — NAPROXEN 500 MG PO TABS: 500.0000 mg | ORAL_TABLET | Freq: Two times a day (BID) | ORAL | 5 refills | Status: DC

## 2017-05-20 NOTE — Progress Notes (Signed)
Subjective:    Patient ID: Doris Foster, female    DOB: 27-Aug-1980, 36 y.o.   MRN: [id number]  HPI She has had left knee pain for several years getting gradually worse over the last few months.  She works doing Animator on them.  She has to twist to the left multiple times a day.She has no direct trauma, no redness.  She has feeling the knee may give way but it has not.  She has tried ice, heat, rest, rubs and Advil with little help.   Review of Systems  HENT: Negative for congestion.   Respiratory: Negative for cough and shortness of breath.   Cardiovascular: Negative for leg swelling.  Endocrine: Negative for cold intolerance.  Musculoskeletal: Positive for arthralgias and gait problem.  Allergic/Immunologic: Negative for environmental allergies.   History reviewed. No pertinent past medical history.  History reviewed. No pertinent surgical history.  Current Outpatient Prescriptions on File Prior to Visit  Medication Sig Dispense Refill  . fexofenadine-pseudoephedrine (ALLEGRA-D 24) 180-240 MG 24 hr tablet Take 1 tablet by mouth daily. As needed.    . fluconazole (DIFLUCAN) 150 MG tablet One po qd prn yeast infection; may repeat in 3-4 days if needed 2 tablet 0  . valACYclovir (VALTREX) 1000 MG tablet TAKE 1 TABLET BY MOUTH 2 (TWO) TIMES DAILY. 20 tablet 2   No current facility-administered medications on file prior to visit.     Social History   Social History  . Marital status: Married    Spouse name: N/A  . Number of children: N/A  . Years of education: N/A   Occupational History  . Not on file.   Social History Main Topics  . Smoking status: Current Every Day Smoker    Packs/day: 0.25    Years: 3.00    Types: Cigarettes  . Smokeless tobacco: Never Used  . Alcohol use Yes     Comment: occas social  . Drug use: No  . Sexual activity: Yes    Birth control/ protection: Condom   Other Topics Concern  . Not on file   Social History Narrative    . No narrative on file    History reviewed. No pertinent family history.  History of hypertension in the family.  BP 133/88   Pulse 75   Temp 98 F (36.7 C)   Ht 5\' 5"  (1.651 m)   Wt 206 lb (93.4 kg)   LMP 05/19/2017 (Exact Date)   BMI 34.28 kg/m      Objective:   Physical Exam  Constitutional: She is oriented to person, place, and time. She appears well-developed and well-nourished.  HENT:  Head: Normocephalic and atraumatic.  Eyes: Pupils are equal, round, and reactive to light. Conjunctivae and EOM are normal.  Neck: Normal range of motion. Neck supple.  Cardiovascular: Normal rate, regular rhythm and intact distal pulses.   Pulmonary/Chest: Effort normal.  Abdominal: Soft.  Musculoskeletal: She exhibits tenderness (Left knee diffusely tender, no effusion, ROM 0 to 115, slgiht medial joint line pain, limp left, no redness, stable, right knee negative.).  Neurological: She is alert and oriented to person, place, and time. She displays normal reflexes. No cranial nerve deficit. She exhibits normal muscle tone. Coordination normal.  Skin: Skin is warm and dry.  Psychiatric: She has a normal mood and affect. Her behavior is normal. Judgment and thought content normal.  Vitals reviewed.   X-rays of the left knee done, reported separately.      Assessment &  Plan:   Encounter Diagnosis  Name Primary?  . Chronic pain of left knee Yes   PROCEDURE NOTE:  The patient requests injections of the left knee , verbal consent was obtained.  The left knee was prepped appropriately after time out was performed.   Sterile technique was observed and injection of 1 cc of Depo-Medrol 40 mg with several cc's of plain xylocaine. Anesthesia was provided by ethyl chloride and a 20-gauge needle was used to inject the knee area. The injection was tolerated well.  A band aid dressing was applied.  The patient was advised to apply ice later today and tomorrow to the injection sight as  needed.  I will begin Naprosyn 500 po bid pc.  Precautions discussed.  Pain medicine given also.  I have reviewed the [place] Controlled Substance Reporting System web site prior to prescribing narcotic medicine for this patient.  Call if any problem.  Precautions discussed.  Return in three weeks.  Consider MRI if not improved.  Electronically Signed Darreld McleanWayne Eathan Groman, MD 10/23/20188:54 AM

## 2017-05-20 NOTE — Patient Instructions (Addendum)
Steps to Quit Smoking Smoking tobacco can be bad for your health. It can also affect almost every organ in your body. Smoking puts you and people around you at risk for many serious Doris Foster-lasting (chronic) diseases. Quitting smoking is hard, but it is one of the best things that you can do for your health. It is never too late to quit. What are the benefits of quitting smoking? When you quit smoking, you lower your risk for getting serious diseases and conditions. They can include:  Lung cancer or lung disease.  Heart disease.  Stroke.  Heart attack.  Not being able to have children (infertility).  Weak bones (osteoporosis) and broken bones (fractures).  If you have coughing, wheezing, and shortness of breath, those symptoms may get better when you quit. You may also get sick less often. If you are pregnant, quitting smoking can help to lower your chances of having a baby of low birth weight. What can I do to help me quit smoking? Talk with your doctor about what can help you quit smoking. Some things you can do (strategies) include:  Quitting smoking totally, instead of slowly cutting back how much you smoke over a period of time.  Going to in-person counseling. You are more likely to quit if you go to many counseling sessions.  Using resources and support systems, such as: ? Online chats with a counselor. ? Phone quitlines. ? Printed self-help materials. ? Support groups or group counseling. ? Text messaging programs. ? Mobile phone apps or applications.  Taking medicines. Some of these medicines may have nicotine in them. If you are pregnant or breastfeeding, do not take any medicines to quit smoking unless your doctor says it is okay. Talk with your doctor about counseling or other things that can help you.  Talk with your doctor about using more than one strategy at the same time, such as taking medicines while you are also going to in-person counseling. This can help make  quitting easier. What things can I do to make it easier to quit? Quitting smoking might feel very hard at first, but there is a lot that you can do to make it easier. Take these steps:  Talk to your family and friends. Ask them to support and encourage you.  Call phone quitlines, reach out to support groups, or work with a counselor.  Ask people who smoke to not smoke around you.  Avoid places that make you want (trigger) to smoke, such as: ? Bars. ? Parties. ? Smoke-break areas at work.  Spend time with people who do not smoke.  Lower the stress in your life. Stress can make you want to smoke. Try these things to help your stress: ? Getting regular exercise. ? Deep-breathing exercises. ? Yoga. ? Meditating. ? Doing a body scan. To do this, close your eyes, focus on one area of your body at a time from head to toe, and notice which parts of your body are tense. Try to relax the muscles in those areas.  Download or buy apps on your mobile phone or tablet that can help you stick to your quit plan. There are many free apps, such as QuitGuide from the CDC (Centers for Disease Control and Prevention). You can find more support from smokefree.gov and other websites.  This information is not intended to replace advice given to you by your health care provider. Make sure you discuss any questions you have with your health care provider. Document Released: 05/11/2009 Document   Revised: 03/12/2016 Document Reviewed: 11/29/2014 Elsevier Interactive Patient Education  2018 Elsevier Inc.  Knee Exercises Ask your health care provider which exercises are safe for you. Do exercises exactly as told by your health care provider and adjust them as directed. It is normal to feel mild stretching, pulling, tightness, or discomfort as you do these exercises, but you should stop right away if you feel sudden pain or your pain gets worse.Do not begin these exercises until told by your health care  provider. STRETCHING AND RANGE OF MOTION EXERCISES These exercises warm up your muscles and joints and improve the movement and flexibility of your knee. These exercises also help to relieve pain, numbness, and tingling. Exercise A: Knee Extension, Prone 1. Lie on your abdomen on a bed. 2. Place your left / right knee just beyond the edge of the surface so your knee is not on the bed. You can put a towel under your left / right thigh just above your knee for comfort. 3. Relax your leg muscles and allow gravity to straighten your knee. You should feel a stretch behind your left / right knee. 4. Hold this position for __________ seconds. 5. Scoot up so your knee is supported between repetitions. Repeat __________ times. Complete this stretch __________ times a day. Exercise B: Knee Flexion, Active  1. Lie on your back with both knees straight. If this causes back discomfort, bend your left / right knee so your foot is flat on the floor. 2. Slowly slide your left / right heel back toward your buttocks until you feel a gentle stretch in the front of your knee or thigh. 3. Hold this position for __________ seconds. 4. Slowly slide your left / right heel back to the starting position. Repeat __________ times. Complete this exercise __________ times a day. Exercise C: Quadriceps, Prone  1. Lie on your abdomen on a firm surface, such as a bed or padded floor. 2. Bend your left / right knee and hold your ankle. If you cannot reach your ankle or pant leg, loop a belt around your foot and grab the belt instead. 3. Gently pull your heel toward your buttocks. Your knee should not slide out to the side. You should feel a stretch in the front of your thigh and knee. 4. Hold this position for __________ seconds. Repeat __________ times. Complete this stretch __________ times a day. Exercise D: Hamstring, Supine 1. Lie on your back. 2. Loop a belt or towel over the ball of your left / right foot. The ball  of your foot is on the walking surface, right under your toes. 3. Straighten your left / right knee and slowly pull on the belt to raise your leg until you feel a gentle stretch behind your knee. ? Do not let your left / right knee bend while you do this. ? Keep your other leg flat on the floor. 4. Hold this position for __________ seconds. Repeat __________ times. Complete this stretch __________ times a day. STRENGTHENING EXERCISES These exercises build strength and endurance in your knee. Endurance is the ability to use your muscles for a Doris Foster time, even after they get tired. Exercise E: Quadriceps, Isometric  1. Lie on your back with your left / right leg extended and your other knee bent. Put a rolled towel or small pillow under your knee if told by your health care provider. 2. Slowly tense the muscles in the front of your left / right thigh. You should see your   kneecap slide up toward your hip or see increased dimpling just above the knee. This motion will push the back of the knee toward the floor. 3. For __________ seconds, keep the muscle as tight as you can without increasing your pain. 4. Relax the muscles slowly and completely. Repeat __________ times. Complete this exercise __________ times a day. Exercise F: Straight Leg Raises - Quadriceps 1. Lie on your back with your left / right leg extended and your other knee bent. 2. Tense the muscles in the front of your left / right thigh. You should see your kneecap slide up or see increased dimpling just above the knee. Your thigh may even shake a bit. 3. Keep these muscles tight as you raise your leg 4-6 inches (10-15 cm) off the floor. Do not let your knee bend. 4. Hold this position for __________ seconds. 5. Keep these muscles tense as you lower your leg. 6. Relax your muscles slowly and completely after each repetition. Repeat __________ times. Complete this exercise __________ times a day. Exercise G: Hamstring,  Isometric 1. Lie on your back on a firm surface. 2. Bend your left / right knee approximately __________ degrees. 3. Dig your left / right heel into the surface as if you are trying to pull it toward your buttocks. Tighten the muscles in the back of your thighs to dig as hard as you can without increasing any pain. 4. Hold this position for __________ seconds. 5. Release the tension gradually and allow your muscles to relax completely for __________ seconds after each repetition. Repeat __________ times. Complete this exercise __________ times a day. Exercise H: Hamstring Curls  If told by your health care provider, do this exercise while wearing ankle weights. Begin with __________ weights. Then increase the weight by 1 lb (0.5 kg) increments. Do not wear ankle weights that are more than __________. 1. Lie on your abdomen with your legs straight. 2. Bend your left / right knee as far as you can without feeling pain. Keep your hips flat against the floor. 3. Hold this position for __________ seconds. 4. Slowly lower your leg to the starting position.  Repeat __________ times. Complete this exercise __________ times a day. Exercise I: Squats (Quadriceps) 1. Stand in front of a table, with your feet and knees pointing straight ahead. You may rest your hands on the table for balance but not for support. 2. Slowly bend your knees and lower your hips like you are going to sit in a chair. ? Keep your weight over your heels, not over your toes. ? Keep your lower legs upright so they are parallel with the table legs. ? Do not let your hips go lower than your knees. ? Do not bend lower than told by your health care provider. ? If your knee pain increases, do not bend as low. 3. Hold the squat position for __________ seconds. 4. Slowly push with your legs to return to standing. Do not use your hands to pull yourself to standing. Repeat __________ times. Complete this exercise __________ times a  day. Exercise J: Wall Slides (Quadriceps)  1. Lean your back against a smooth wall or door while you walk your feet out 18-24 inches (46-61 cm) from it. 2. Place your feet hip-width apart. 3. Slowly slide down the wall or door until your knees bend __________ degrees. Keep your knees over your heels, not over your toes. Keep your knees in line with your hips. 4. Hold for __________ seconds. Repeat   __________ times. Complete this exercise __________ times a day. Exercise K: Straight Leg Raises - Hip Abductors 1. Lie on your side with your left / right leg in the top position. Lie so your head, shoulder, knee, and hip line up. You may bend your bottom knee to help you keep your balance. 2. Roll your hips slightly forward so your hips are stacked directly over each other and your left / right knee is facing forward. 3. Leading with your heel, lift your top leg 4-6 inches (10-15 cm). You should feel the muscles in your outer hip lifting. ? Do not let your foot drift forward. ? Do not let your knee roll toward the ceiling. 4. Hold this position for __________ seconds. 5. Slowly return your leg to the starting position. 6. Let your muscles relax completely after each repetition. Repeat __________ times. Complete this exercise __________ times a day. Exercise L: Straight Leg Raises - Hip Extensors 1. Lie on your abdomen on a firm surface. You can put a pillow under your hips if that is more comfortable. 2. Tense the muscles in your buttocks and lift your left / right leg about 4-6 inches (10-15 cm). Keep your knee straight as you lift your leg. 3. Hold this position for __________ seconds. 4. Slowly lower your leg to the starting position. 5. Let your leg relax completely after each repetition. Repeat __________ times. Complete this exercise __________ times a day. This information is not intended to replace advice given to you by your health care provider. Make sure you discuss any questions you  have with your health care provider. Document Released: 05/29/2005 Document Revised: 04/08/2016 Document Reviewed: 05/21/2015 Elsevier Interactive Patient Education  2018 Elsevier Inc.  

## 2017-06-10 ENCOUNTER — Encounter: Payer: Self-pay | Admitting: Orthopaedic Surgery

## 2017-06-10 ENCOUNTER — Ambulatory Visit: Payer: 59 | Admitting: Orthopaedic Surgery

## 2017-06-10 ENCOUNTER — Other Ambulatory Visit: Payer: Self-pay | Admitting: Nurse Practitioner

## 2017-06-10 ENCOUNTER — Telehealth: Payer: Self-pay | Admitting: Nurse Practitioner

## 2017-06-10 VITALS — BP 124/87 | HR 77 | Temp 97.3°F | Ht 65.0 in | Wt 206.0 lb

## 2017-06-10 DIAGNOSIS — M25562 Pain in left knee: Secondary | ICD-10-CM | POA: Diagnosis not present

## 2017-06-10 DIAGNOSIS — G8929 Other chronic pain: Secondary | ICD-10-CM | POA: Diagnosis not present

## 2017-06-10 MED ORDER — BUPROPION HCL ER (XL) 150 MG PO TB24: 150.0000 mg | ORAL_TABLET | Freq: Every day | ORAL | 2 refills | Status: DC

## 2017-06-10 NOTE — Patient Instructions (Signed)
Steps to Quit Smoking Smoking tobacco can be bad for your health. It can also affect almost every organ in your body. Smoking puts you and people around you at risk for many serious Dennis Killilea-lasting (chronic) diseases. Quitting smoking is hard, but it is one of the best things that you can do for your health. It is never too late to quit. What are the benefits of quitting smoking? When you quit smoking, you lower your risk for getting serious diseases and conditions. They can include:  Lung cancer or lung disease.  Heart disease.  Stroke.  Heart attack.  Not being able to have children (infertility).  Weak bones (osteoporosis) and broken bones (fractures).  If you have coughing, wheezing, and shortness of breath, those symptoms may get better when you quit. You may also get sick less often. If you are pregnant, quitting smoking can help to lower your chances of having a baby of low birth weight. What can I do to help me quit smoking? Talk with your doctor about what can help you quit smoking. Some things you can do (strategies) include:  Quitting smoking totally, instead of slowly cutting back how much you smoke over a period of time.  Going to in-person counseling. You are more likely to quit if you go to many counseling sessions.  Using resources and support systems, such as: ? Online chats with a counselor. ? Phone quitlines. ? Printed self-help materials. ? Support groups or group counseling. ? Text messaging programs. ? Mobile phone apps or applications.  Taking medicines. Some of these medicines may have nicotine in them. If you are pregnant or breastfeeding, do not take any medicines to quit smoking unless your doctor says it is okay. Talk with your doctor about counseling or other things that can help you.  Talk with your doctor about using more than one strategy at the same time, such as taking medicines while you are also going to in-person counseling. This can help make  quitting easier. What things can I do to make it easier to quit? Quitting smoking might feel very hard at first, but there is a lot that you can do to make it easier. Take these steps:  Talk to your family and friends. Ask them to support and encourage you.  Call phone quitlines, reach out to support groups, or work with a counselor.  Ask people who smoke to not smoke around you.  Avoid places that make you want (trigger) to smoke, such as: ? Bars. ? Parties. ? Smoke-break areas at work.  Spend time with people who do not smoke.  Lower the stress in your life. Stress can make you want to smoke. Try these things to help your stress: ? Getting regular exercise. ? Deep-breathing exercises. ? Yoga. ? Meditating. ? Doing a body scan. To do this, close your eyes, focus on one area of your body at a time from head to toe, and notice which parts of your body are tense. Try to relax the muscles in those areas.  Download or buy apps on your mobile phone or tablet that can help you stick to your quit plan. There are many free apps, such as QuitGuide from the CDC (Centers for Disease Control and Prevention). You can find more support from smokefree.gov and other websites.  This information is not intended to replace advice given to you by your health care provider. Make sure you discuss any questions you have with your health care provider. Document Released: 05/11/2009 Document   Revised: 03/12/2016 Document Reviewed: 11/29/2014 Elsevier Interactive Patient Education  2018 Elsevier Inc.  

## 2017-06-10 NOTE — Telephone Encounter (Signed)
Patient states that she would like to start with the Buproprion. She stated she has taken it in the past and it helped with both depression and weight loss

## 2017-06-10 NOTE — Telephone Encounter (Signed)
done

## 2017-06-10 NOTE — Progress Notes (Signed)
Patient GN:FAOZH:Doris Foster, female DOB:06/28/1981, 36 y.o. YQM:[id number]  Chief Complaint  Patient presents with  . Follow-up    Left knee    HPI  Elzie RingsKelly Hicks Foster is a 36 y.o. female who has had pain of the left knee.  She is much improved after the injection last time.  She has no pain, no swelling now.  She is taking the Naprosyn.  She has no new trauma. HPI  Body mass index is 34.28 kg/m.  ROS  Review of Systems  HENT: Negative for congestion.   Respiratory: Negative for cough and shortness of breath.   Cardiovascular: Negative for leg swelling.  Endocrine: Negative for cold intolerance.  Musculoskeletal: Positive for arthralgias and gait problem.  Allergic/Immunologic: Negative for environmental allergies.    History reviewed. No pertinent past medical history.  History reviewed. No pertinent surgical history.  History reviewed. No pertinent family history.  Social History Social History   Tobacco Use  . Smoking status: Current Every Day Smoker    Packs/day: 0.25    Years: 3.00    Pack years: 0.75    Types: Cigarettes  . Smokeless tobacco: Never Used  Substance Use Topics  . Alcohol use: Yes    Comment: occas social  . Drug use: No    No Known Allergies  Current Outpatient Medications  Medication Sig Dispense Refill  . fexofenadine-pseudoephedrine (ALLEGRA-D 24) 180-240 MG 24 hr tablet Take 1 tablet by mouth daily. As needed.    . fluconazole (DIFLUCAN) 150 MG tablet One po qd prn yeast infection; may repeat in 3-4 days if needed 2 tablet 0  . HYDROcodone-acetaminophen (NORCO/VICODIN) 5-325 MG tablet One tablet every four hours as needed for acute pain.  Limit of five days per  statue. 30 tablet 0  . naproxen (NAPROSYN) 500 MG tablet Take 1 tablet (500 mg total) by mouth 2 (two) times daily with a meal. 60 tablet 5  . valACYclovir (VALTREX) 1000 MG tablet TAKE 1 TABLET BY MOUTH 2 (TWO) TIMES DAILY. 20 tablet 2   No current  facility-administered medications for this visit.      Physical Exam  Blood pressure 124/87, pulse 77, temperature (!) 97.3 F (36.3 C), height 5\' 5"  (1.651 m), weight 206 lb (93.4 kg), last menstrual period 05/19/2017.  Constitutional: overall normal hygiene, normal nutrition, well developed, normal grooming, normal body habitus. Assistive device:none  Musculoskeletal: gait and station Limp none, muscle tone and strength are normal, no tremors or atrophy is present.  .  Neurological: coordination overall normal.  Deep tendon reflex/nerve stretch intact.  Sensation normal.  Cranial nerves II-XII intact.   Skin:   Normal overall no scars, lesions, ulcers or rashes. No psoriasis.  Psychiatric: Alert and oriented x 3.  Recent memory intact, remote memory unclear.  Normal mood and affect. Well groomed.  Good eye contact.  Cardiovascular: overall no swelling, no varicosities, no edema bilaterally, normal temperatures of the legs and arms, no clubbing, cyanosis and good capillary refill.  Lymphatic: palpation is normal.  All other systems reviewed and are negative   Left knee has full motion and slight crepitus.  She has no effusion.  Gait is normal. NV intact.  The patient has been educated about the nature of the problem(s) and counseled on treatment options.  The patient appeared to understand what I have discussed and is in agreement with it.  Encounter Diagnosis  Name Primary?  . Chronic pain of left knee Yes    PLAN Call if  any problems.  Precautions discussed.  Continue current medications.   Return to clinic prn   Electronically Signed Darreld McleanWayne Karey Stucki, MD 11/13/201811:25 AM

## 2017-06-10 NOTE — Telephone Encounter (Signed)
So 2 thoughts: we really need to start with one med. There is Buproprion which helps both depression and weight. Or we could focus on one problem. Need some guidance where to start. Thanks.

## 2017-06-10 NOTE — Telephone Encounter (Signed)
Will start with 150 mg. Let us know in 3-4 weeks if we need to increase. Which pharmacy?

## 2017-06-10 NOTE — Telephone Encounter (Signed)
[facility] [address]

## 2017-06-10 NOTE — Telephone Encounter (Signed)
Pt was seen by Doris Foster recently and has stated that she would like to be put on an antidepressant and weight loss medication. Pt states that Doris JonesCarolyn told her to let her know. Please advise.

## 2017-07-04 ENCOUNTER — Encounter: Payer: Self-pay | Admitting: Nurse Practitioner

## 2017-07-04 ENCOUNTER — Telehealth: Payer: Self-pay | Admitting: Nurse Practitioner

## 2017-07-04 NOTE — Telephone Encounter (Signed)
Pt is a little better but not fully. Pt was told to call back if not better. Pt was seen a little over three weeks. Pt was put on buPROPion (WELLBUTRIN XL) 150 MG 24 hr tablet.  [facility] [address] [place]

## 2017-07-04 NOTE — Telephone Encounter (Signed)
Seen July for physical and med was suggested. Called a few weeks ago and was put on wellbutrin. Pt is doing better on med but still feeling overwhelmed at times. Having some emotional moments. No thoughts of hurting her self or anyone else. Please advise.

## 2017-07-04 NOTE — Telephone Encounter (Signed)
Response sent through mychart

## 2017-07-06 ENCOUNTER — Encounter: Payer: Self-pay | Admitting: Nurse Practitioner

## 2017-07-09 ENCOUNTER — Other Ambulatory Visit: Payer: Self-pay | Admitting: Nurse Practitioner

## 2017-07-09 MED ORDER — BUPROPION HCL ER (XL) 300 MG PO TB24: 300.0000 mg | ORAL_TABLET | Freq: Every day | ORAL | 5 refills | Status: DC

## 2017-07-13 ENCOUNTER — Encounter: Payer: Self-pay | Admitting: Nurse Practitioner

## 2017-07-14 ENCOUNTER — Other Ambulatory Visit: Payer: Self-pay | Admitting: Nurse Practitioner

## 2017-07-15 ENCOUNTER — Encounter: Payer: Self-pay | Admitting: Nurse Practitioner

## 2017-09-05 ENCOUNTER — Other Ambulatory Visit: Payer: Self-pay | Admitting: Nurse Practitioner

## 2017-10-24 ENCOUNTER — Ambulatory Visit: Payer: 59 | Admitting: Family Medicine

## 2017-10-24 ENCOUNTER — Encounter: Payer: Self-pay | Admitting: Family Medicine

## 2017-10-24 VITALS — BP 118/80 | Temp 98.2°F | Ht 65.0 in | Wt 204.0 lb

## 2017-10-24 DIAGNOSIS — R109 Unspecified abdominal pain: Secondary | ICD-10-CM | POA: Diagnosis not present

## 2017-10-24 DIAGNOSIS — J111 Influenza due to unidentified influenza virus with other respiratory manifestations: Secondary | ICD-10-CM

## 2017-10-24 MED ORDER — OSELTAMIVIR PHOSPHATE 75 MG PO CAPS: 75.0000 mg | ORAL_CAPSULE | Freq: Two times a day (BID) | ORAL | 0 refills | Status: DC

## 2017-10-24 NOTE — Patient Instructions (Signed)

## 2017-10-24 NOTE — Progress Notes (Signed)
   Subjective:    Patient ID: Elzie RingsKelly Hicks Pruitt, female    DOB: 1981/01/11, 37 y.o.   MRN: [id number]  Sinusitis  This is a new problem. The current episode started yesterday. Associated symptoms include congestion, coughing, headaches and a sore throat. Pertinent negatives include no ear pain or shortness of breath. Past treatments include acetaminophen.   Patient was significant body aches sore throat congestion not feeling good family members diagnosed with the flu   Review of Systems  Constitutional: Negative for activity change and fever.  HENT: Positive for congestion, rhinorrhea and sore throat. Negative for ear pain.   Eyes: Negative for discharge.  Respiratory: Positive for cough. Negative for shortness of breath and wheezing.   Cardiovascular: Negative for chest pain.  Neurological: Positive for headaches.       Objective:   Physical Exam  Constitutional: She appears well-developed.  HENT:  Head: Normocephalic.  Right Ear: External ear normal.  Left Ear: External ear normal.  Nose: Nose normal.  Mouth/Throat: Oropharynx is clear and moist. No oropharyngeal exudate.  Eyes: Right eye exhibits no discharge. Left eye exhibits no discharge.  Neck: Neck supple. No tracheal deviation present.  Cardiovascular: Normal rate and normal heart sounds.  No murmur heard. Pulmonary/Chest: Effort normal and breath sounds normal. She has no wheezes. She has no rales.  Lymphadenopathy:    She has no cervical adenopathy.  Skin: Skin is warm and dry.  Nursing note and vitals reviewed.  Patient denies any rectal bleeding.  States appetite okay no vomiting       Assessment & Plan:  Probable flu Tamiflu prescribed Warnings discussed Influenza-the patient was diagnosed with influenza. Patient/family educated about the flu and warning signs to watch for. If difficulty breathing, severe neck pain and stiffness, cyanosis, disorientation, or progressive worsening then immediately get  rechecked at that ER. If progressive symptoms be certain to be rechecked. Supportive measures such as Tylenol/ibuprofen was discussed. No aspirin use in children. And influenza home care instruction sheet was given. No antibiotic indicated  Patient also states chronic abdominal intermittent pain and loose stools been present for months she states that she did see Eber JonesCarolyn for this who recommended GI referral if ongoing patient is requesting GI referral

## 2017-10-30 ENCOUNTER — Encounter: Payer: Self-pay | Admitting: Family Medicine

## 2017-10-30 ENCOUNTER — Encounter (INDEPENDENT_AMBULATORY_CARE_PROVIDER_SITE_OTHER): Payer: Self-pay

## 2017-11-16 ENCOUNTER — Other Ambulatory Visit: Payer: Self-pay | Admitting: Orthopaedic Surgery

## 2018-01-24 ENCOUNTER — Other Ambulatory Visit: Payer: Self-pay | Admitting: Nurse Practitioner

## 2018-02-02 ENCOUNTER — Encounter: Payer: Self-pay | Admitting: Family Medicine

## 2018-05-04 ENCOUNTER — Other Ambulatory Visit: Payer: Self-pay | Admitting: *Deleted

## 2018-05-04 MED ORDER — VALACYCLOVIR HCL 1 G PO TABS: ORAL_TABLET | ORAL | 2 refills | Status: DC

## 2018-06-16 ENCOUNTER — Telehealth: Payer: Self-pay | Admitting: Obstetrics & Gynecology

## 2018-06-16 NOTE — Telephone Encounter (Signed)
Left message @ 4:45 pm. JSY 

## 2018-06-16 NOTE — Telephone Encounter (Signed)
Left message @ 1:41 pm. JSY

## 2018-06-16 NOTE — Telephone Encounter (Signed)
Patient called stating that she would like for Dr. Despina HiddenEure to sent her [facility] [place]. Please contact pt

## 2018-06-17 ENCOUNTER — Telehealth: Payer: Self-pay | Admitting: Obstetrics & Gynecology

## 2018-06-17 MED ORDER — VALACYCLOVIR HCL 1 G PO TABS: ORAL_TABLET | ORAL | 2 refills | Status: DC

## 2018-06-17 NOTE — Telephone Encounter (Signed)
done

## 2018-06-17 NOTE — Telephone Encounter (Signed)
Spoke with pt. Pt is requesting Valtrex for suppression. Don't have outbreak now. Thanks!! JSY

## 2018-06-17 NOTE — Telephone Encounter (Signed)
Meds ordered this encounter  Medications  . valACYclovir (VALTREX) 1000 MG tablet    Sig: TAKE 1 TABLET BY MOUTH 2 (TWO) TIMES DAILY.    Dispense:  20 tablet    Refill:  2

## 2018-09-15 ENCOUNTER — Other Ambulatory Visit: Payer: Self-pay | Admitting: Family Medicine

## 2018-09-15 NOTE — Telephone Encounter (Signed)
May have this +2 refills needs follow-up office visit 

## 2018-09-30 ENCOUNTER — Telehealth: Payer: Self-pay | Admitting: Obstetrics & Gynecology

## 2018-09-30 NOTE — Telephone Encounter (Signed)
Needs to talk to someone about her Valtrex script

## 2018-09-30 NOTE — Telephone Encounter (Signed)
Pt requests a refill on her valtrex. She says that she has just tried taking it when she has an outbreak but feels like she needs to take it continuous because she is having frequent outbreaks. Advised that I would send her request for refills to a provider and she could check with the pharmacy later tomorrow. Pt verbalized understanding.

## 2018-10-01 ENCOUNTER — Other Ambulatory Visit: Payer: Self-pay | Admitting: Obstetrics & Gynecology

## 2018-10-01 MED ORDER — VALACYCLOVIR HCL 1 G PO TABS: ORAL_TABLET | ORAL | 11 refills | Status: DC

## 2018-10-01 NOTE — Telephone Encounter (Signed)
E prescribed valtrex 1 gram daily

## 2018-10-13 ENCOUNTER — Other Ambulatory Visit: Payer: Self-pay | Admitting: Family Medicine

## 2018-10-13 NOTE — Telephone Encounter (Signed)
Please verify with the patient I am willing to give her a 90-day supply until some of this settles down

## 2018-10-14 ENCOUNTER — Other Ambulatory Visit: Payer: Self-pay | Admitting: Orthopedic Surgery

## 2018-11-26 ENCOUNTER — Telehealth: Payer: Self-pay | Admitting: Family Medicine

## 2018-11-26 MED ORDER — FLUCONAZOLE 150 MG PO TABS: ORAL_TABLET | ORAL | 0 refills | Status: DC

## 2018-11-26 NOTE — Telephone Encounter (Signed)
See other message-11/26/2018-Prescription sent electronically to pharmacy. Patient notified and verbalized understanding.

## 2018-11-26 NOTE — Telephone Encounter (Signed)
Prescription sent electronically to pharmacy.  Patient notified and verbalized understanding. 

## 2018-11-26 NOTE — Telephone Encounter (Signed)
Husband called back and said that wife doesn't get off work until after 4:00.  She tried to call us back during her lunch break but we were closed for lunch.  He said it was going to be hard for her to call us.  Husband said wife said this was the classic symptoms of a yeast infection.  I told husband maybe wife could use her mychart to message [place].

## 2018-11-26 NOTE — Telephone Encounter (Signed)
Diflucan 150 mg 1 p.o. now, may repeat in 1 week if necessary, may use OTC Monistat as needed, #1 with 4 refills

## 2018-11-26 NOTE — Telephone Encounter (Signed)
Pharmacy requesting refill on Fluconazole 150 mg tablet. Take one tablet by mouth once daily prn yeast infection, repeat in 3-4 days if needed. Left message for patient to return call to get symptoms.

## 2018-11-26 NOTE — Telephone Encounter (Signed)
Is it ok to send in Diflucan per protocol

## 2019-01-07 ENCOUNTER — Other Ambulatory Visit: Payer: Self-pay | Admitting: Family Medicine

## 2019-01-08 NOTE — Telephone Encounter (Signed)
Patient needs to schedule follow-up visit either in person or virtual within the next 30 days Then can have 1 refill, further refills upon follow-up visit

## 2019-01-08 NOTE — Telephone Encounter (Signed)
Please schedule appt. Then route to nurses. Thank you

## 2019-01-11 NOTE — Telephone Encounter (Signed)
Attempted to contact pt, unable to lvm due to mail box being full

## 2019-01-20 NOTE — Telephone Encounter (Signed)
Pt has virtual visit scheduled for Friday 6/26. She wants to hold off on refill and discuss with Dr. Nicki Reaper on Friday about trying another medication.

## 2019-01-22 ENCOUNTER — Other Ambulatory Visit: Payer: Self-pay

## 2019-01-22 ENCOUNTER — Ambulatory Visit (INDEPENDENT_AMBULATORY_CARE_PROVIDER_SITE_OTHER): Payer: 59 | Admitting: Family Medicine

## 2019-01-22 DIAGNOSIS — F411 Generalized anxiety disorder: Secondary | ICD-10-CM

## 2019-01-22 DIAGNOSIS — K2900 Acute gastritis without bleeding: Secondary | ICD-10-CM

## 2019-01-22 MED ORDER — PANTOPRAZOLE SODIUM 40 MG PO TBEC: 40.0000 mg | DELAYED_RELEASE_TABLET | Freq: Every day | ORAL | 3 refills | Status: DC

## 2019-01-22 MED ORDER — SERTRALINE HCL 50 MG PO TABS: 50.0000 mg | ORAL_TABLET | Freq: Every day | ORAL | 3 refills | Status: DC

## 2019-01-22 NOTE — Progress Notes (Signed)
   Subjective:    Patient ID: Doris Foster, female    DOB: [birth year], 38 y.o.   MRN: [id number]  HPI stopped wellbutrin. States it did not seem to  Be working. Felt the same when she came off of the med. Has some trouble with social anxiety.  Patient with significant social anxiety.  Gets nervous around crowds also finds her self feeling stressed anxious and on edge denies being depressed but at times does have lack of desire and interest  Needs referral to GI. States you referred her last year but she never got an appointment. Lots of foods irritate her stomach and some issues with acid reflux.  Patient with intermittent gastritis symptoms Virtual Visit via Video Note  I connected with Karen Chafe Pruitt on 01/22/19 at  9:30 AM EDT by a video enabled telemedicine application and verified that I am speaking with the correct person using two identifiers.  Location: Patient: home Provider: office   I discussed the limitations of evaluation and management by telemedicine and the availability of in person appointments. The patient expressed understanding and agreed to proceed.  History of Present Illness:    Observations/Objective:   Assessment and Plan:   Follow Up Instructions:    I discussed the assessment and treatment plan with the patient. The patient was provided an opportunity to ask questions and all were answered. The patient agreed with the plan and demonstrated an understanding of the instructions.   The patient was advised to call back or seek an in-person evaluation if the symptoms worsen or if the condition fails to improve as anticipated.  I provided 25 minutes of non-face-to-face time during this encounter.      Review of Systems  Constitutional: Negative for activity change and appetite change.  HENT: Negative for congestion and rhinorrhea.   Respiratory: Negative for cough and shortness of breath.   Cardiovascular: Negative for chest pain and leg  swelling.  Gastrointestinal: Negative for abdominal pain, nausea and vomiting.  Skin: Negative for color change.  Neurological: Negative for dizziness and weakness.  Psychiatric/Behavioral: Negative for agitation and confusion.       Objective:   Physical Exam  Patient had virtual visit Appears to be in no distress Atraumatic Neuro able to relate and oriented No apparent resp distress Color normal  25 minutes was spent with the patient.  This statement verifies that 25 minutes was indeed spent with the patient.  More than 50% of this visit-total duration of the visit-was spent in counseling and coordination of care. The issues that the patient came in for today as reflected in the diagnosis (s) please refer to documentation for further details.      Assessment & Plan:  Referral to gastroenterology for EGD.  Apparently last time referral occurred patient states it was difficult to get in contact with him  Generalized anxiety disorder with mild depression and social anxiety we discussed multiple different options to go ahead with sertraline 50 mg daily follow-up visit in 3 to 4 weeks follow-up sooner problems Patient defers on counseling but we will send her some information on some books

## 2019-01-23 NOTE — Telephone Encounter (Signed)
I switched her medicine this medicine can be stopped

## 2019-01-26 ENCOUNTER — Encounter: Payer: Self-pay | Admitting: Family Medicine

## 2019-02-05 ENCOUNTER — Telehealth: Payer: Self-pay | Admitting: Family Medicine

## 2019-02-05 NOTE — Telephone Encounter (Signed)
Pt contacted office and states that she is feeling more on edge and has the feeling that she is going to snap. Pt was placed on Zoloft on 01/26/2019. Pt was prescribed Zoloft 50 mg take one tablet by mouth daily. Pt states she has been taking the med daily each morning. Pt is also have occasional nausea. Pt is not having any thoughts of harming self or others. Pt is currently at work until 3:30; if we call her before then we will need to call her work number which is [phone]. Please advise. Thank you

## 2019-02-05 NOTE — Telephone Encounter (Signed)
Left message to return call 

## 2019-02-05 NOTE — Telephone Encounter (Signed)
incr to 100 mg dailu. f u with dr Doris Foster as sched in several weeks

## 2019-02-06 NOTE — Telephone Encounter (Signed)
Nurses Please talk with the patient make sure that she can hold it together If she feels like she is going to hurt herself or hurt others she needs to go to to behavioral health right away for assessment Otherwise follow-up within 1 to 2 weeks with myself and utilize the increased dose Please mail to the patient the papers regarding books that might be helpful Counseling can often be helpful as well Also offered counseling if she is interested we can get the ball rolling on this

## 2019-02-08 ENCOUNTER — Encounter: Payer: Self-pay | Admitting: *Deleted

## 2019-02-08 NOTE — Telephone Encounter (Signed)
Left message to return call 

## 2019-02-09 ENCOUNTER — Other Ambulatory Visit: Payer: Self-pay | Admitting: *Deleted

## 2019-02-09 MED ORDER — SERTRALINE HCL 100 MG PO TABS: 100.0000 mg | ORAL_TABLET | Freq: Every day | ORAL | 0 refills | Status: DC

## 2019-02-09 NOTE — Telephone Encounter (Signed)
Discussed with pt. Pt states she does not have any thoughts of hurting herself or anyone else. And she has tried counseling in the past and it did not really help. She did schedule a follow up with dr Nicki Reaper and zoloft 100mg  sent to pharm.

## 2019-02-19 ENCOUNTER — Telehealth: Payer: Self-pay | Admitting: General Surgery

## 2019-02-19 NOTE — Telephone Encounter (Signed)
Covid-19 screening questions   Do you now or have you had a fever in the last 14 days?no  Do you have any respiratory symptoms of shortness of breath or cough now or in the last 14 days?No  Do you have any family members or close contacts with diagnosed or suspected Covid-19 in the past 14 days?No  Have you been tested for Covid-19 and found to be positive?No  Expressed to the patient she and her healthcare partner will need to wear a mask to the office. Patient verbalized understanding.

## 2019-02-19 NOTE — Telephone Encounter (Signed)
Left a voicemail for the patient to call back and Covid screen for visit on 02/22/2019

## 2019-02-22 ENCOUNTER — Ambulatory Visit: Payer: 59 | Admitting: Nurse Practitioner

## 2019-02-22 ENCOUNTER — Encounter: Payer: Self-pay | Admitting: Nurse Practitioner

## 2019-02-22 VITALS — BP 130/80 | HR 83 | Temp 98.6°F | Ht 65.0 in | Wt 202.0 lb

## 2019-02-22 DIAGNOSIS — R112 Nausea with vomiting, unspecified: Secondary | ICD-10-CM

## 2019-02-22 DIAGNOSIS — K219 Gastro-esophageal reflux disease without esophagitis: Secondary | ICD-10-CM

## 2019-02-22 DIAGNOSIS — R194 Change in bowel habit: Secondary | ICD-10-CM

## 2019-02-22 MED ORDER — HYOSCYAMINE SULFATE 0.125 MG SL SUBL: 0.1250 mg | SUBLINGUAL_TABLET | Freq: Two times a day (BID) | SUBLINGUAL | 2 refills | Status: DC

## 2019-02-22 MED ORDER — ONDANSETRON HCL 4 MG PO TABS: 4.0000 mg | ORAL_TABLET | Freq: Three times a day (TID) | ORAL | 2 refills | Status: DC | PRN

## 2019-02-22 NOTE — Progress Notes (Signed)
ASSESSMENT / PLAN:    63. 38 yo female with longstanding ( years ) history of intermittent nausea, bloating , altered bowel habits. She has bouts of fecal urgency with cramps. Symptoms most often occur after eating but also worse with stress. Stool consistency varies between liquid and formed but generally formed despite associated urgency. No blood in stool or weight loss. Suspect IBS. IBD or other pathology seems unlikely but not yet excluded.   -Trial of fiber -Citrucel once daily. Can discontinue if causes bloating.  -Levsin 0.125 mg SL TID as needed for cramps -trial of Zofran as needed for nausea  2. GERD manifested as heartburn.  -Continue GERD medication which was just started by PCP she says but I do not see if on home med list. Would take prior to evening meal since symptoms generally occur at night. Will try and verify what she is actually taking.  -anti-reflux measures discussed ( caffeine intake, elevated HOB, bed on empty stomach, etc..) Written GERD information given   HPI:    Chief Complaint:   IBS, nausea  Patient is a 38 yo female, new to the practice with hx of general anxiety disorder and depression. referred by PCP. She gives a longstanding history of abdominal bloating , intermittent bouts of fecal urgency with and without diarrhea. In fact, most of the time her stools are not loose, just urgent. Told in past she probably has IBS, symptoms worse over last five years.  She has identified several food triggers such as lettuce, green peppers, and onions though response is inconsistent. Sometimes these foods don't cause her any issues.  . With episodes she sometimes cannot even make it home from restaurant. Sometimes takes tums which helps but causes constipation.  The urgency to have a bowel movement can cause panic about ability to bathroom in a timely manner. Stool consistency varies between loose and formed, sometimes she is constipated.  She has not  rectal bleeding. No weight loss. She has chronic left knee pain and has recently developed left wrist pain but otherwise no arthralgias. No [place] of IBD.   Doris Foster gets heartburn from time to time, mainly at night and if she eats pizza / spicy foods. Tums help heartburn and above bouts of urgent bowel movements but can cause constipation. Drinks beverages with caffeine until around 5 pm. No dysphagia. Says PCP just started her on a medication for symptoms but not sure of the name.    Data Reviewed:  No recent labs Aug 2018 alk phos transiently elevated at 159, down to 124 on recheck.   Past Medical History:  Diagnosis Date   Anxiety    Depression    History of cold sores    Seasonal allergies      Past Surgical History:  Procedure Laterality Date   NO PAST SURGERIES     Family History  Problem Relation Age of Onset   Heart disease Father    Dementia Father    Cancer Maternal Grandmother        ? lung-non-smoker   Irritable bowel syndrome Paternal Aunt        runs on paternal side   Colon cancer Neg Hx    Rectal cancer Neg Hx    Esophageal cancer Neg Hx    Social History   Tobacco Use   Smoking status: Current Every Day Smoker    Packs/day: 0.25  Years: 3.00  °  Pack years: 0.75  °  Types: Cigarettes  °• Smokeless tobacco: Never Used  °Substance Use Topics  °• Alcohol use: Yes  °  Comment: occas social  °• Drug use: No  ° °Current Outpatient Medications  °Medication Sig Dispense Refill  °• fexofenadine-pseudoephedrine (ALLEGRA-D 24) 180-240 MG 24 hr tablet Take 1 tablet by mouth daily. As needed.    °• HYDROcodone-acetaminophen (NORCO/VICODIN) 5-325 MG tablet One tablet every four hours as needed for acute pain.  Limit of five days per [place]. 30 tablet 0  °• naproxen (NAPROSYN) 500 MG tablet Take 500 mg by mouth. Once or twice a week as needed    °• sertraline (ZOLOFT) 100 MG tablet Take 1 tablet (100 mg total) by mouth daily. 30 tablet 0  °• valACYclovir  (VALTREX) 1000 MG tablet TAKE 1 TABLET DAILY FOR SUPPRESSION 30 tablet 11  ° °No current facility-administered medications for this visit.   ° °No Known Allergies ° ° °Review of Systems: °Positive for anxiety, depression, fatigue, and headaches.  All other systems reviewed and negative except where noted in HPI.  ° ° ° °Physical Exam:   ° °Wt Readings from Last 3 Encounters:  °02/22/19 202 lb (91.6 kg)  °10/24/17 204 lb (92.5 kg)  °06/10/17 206 lb (93.4 kg)  ° ° °BP 130/80    Pulse 83    Temp 98.6 °F (37 °C)    Ht 5' 5" (1.651 m)    Wt 202 lb (91.6 kg)    BMI 33.61 kg/m²  °Constitutional:  Pleasant female in no acute distress. °Psychiatric: Normal mood and affect. Behavior is normal. °EENT: Pupils normal.  Conjunctivae are normal. No scleral icterus. °Neck supple.  °Cardiovascular: Normal rate, regular rhythm. No edema °Pulmonary/chest: Effort normal and breath sounds normal. No wheezing, rales or rhonchi. °Abdominal: Soft, nondistended, nontender. Bowel sounds active throughout. There are no masses palpable. No hepatomegaly. °Neurological: Alert and oriented to person place and time. °Skin: Skin is warm and dry. No rashes noted. ° °Doris Guenther, NP  02/22/2019, 3:16 PM ° °Cc: °Luking, Scott A, MD ° °

## 2019-02-22 NOTE — Patient Instructions (Addendum)
If you are age 37 or older, your body mass index should be between 23-30. Your Body mass index is 33.61 kg/m. If this is out of the aforementioned range listed, please consider follow up with your Primary Care Provider.  If you are age 52 or younger, your body mass index should be between 19-25. Your Body mass index is 33.61 kg/m. If this is out of the aformentioned range listed, please consider follow up with your Primary Care Provider.   We have sent the following medications to your pharmacy for you to pick up at your convenience: [place] every day.  Use a wedge pillow.  No caffeine after lunch.  You have been given GERD literature.  Follow up with me on March 22, 2019 at 2 pm.  Thank you for choosing me and [facility].   Tye Savoy, NP

## 2019-02-23 NOTE — Progress Notes (Signed)
Assessment and plan reviewed 

## 2019-02-24 ENCOUNTER — Ambulatory Visit (INDEPENDENT_AMBULATORY_CARE_PROVIDER_SITE_OTHER): Payer: 59 | Admitting: Family Medicine

## 2019-02-24 ENCOUNTER — Other Ambulatory Visit: Payer: Self-pay

## 2019-02-24 ENCOUNTER — Encounter: Payer: Self-pay | Admitting: Family Medicine

## 2019-02-24 VITALS — Wt 202.0 lb

## 2019-02-24 DIAGNOSIS — Z1329 Encounter for screening for other suspected endocrine disorder: Secondary | ICD-10-CM

## 2019-02-24 DIAGNOSIS — R5383 Other fatigue: Secondary | ICD-10-CM

## 2019-02-24 MED ORDER — SERTRALINE HCL 100 MG PO TABS: ORAL_TABLET | ORAL | 6 refills | Status: DC

## 2019-02-24 NOTE — Progress Notes (Signed)
   Subjective:    Patient ID: Doris Foster, female    DOB: 1980/08/27, 38 y.o.   MRN: [id number]  Depression        This is a chronic problem.  The problem has been gradually improving (slightly edgy) since onset.  Associated symptoms include no appetite change.  Pt is taking Zoloft 100 mg daily. She takes it when she gets to work. Pt states she is not sure if the med is making her sleepy. Pt states she sometimes has to take a nap after she gets home from work.  Patient states she finds her self less anxious less depressed but still has times where she wants to come home and just stay in bed she is trying to best she can at trying to improve how she is feeling she feels Zoloft is helping some but she would like to try a stronger dose after our discussion Virtual Visit via Video Note  I connected with Doris Foster on 02/24/19 at  8:30 AM EDT by a video enabled telemedicine application and verified that I am speaking with the correct person using two identifiers.  Location: Patient: home Provider: office   I discussed the limitations of evaluation and management by telemedicine and the availability of in person appointments. The patient expressed understanding and agreed to proceed.  History of Present Illness:    Observations/Objective:   Assessment and Plan:   Follow Up Instructions:    I discussed the assessment and treatment plan with the patient. The patient was provided an opportunity to ask questions and all were answered. The patient agreed with the plan and demonstrated an understanding of the instructions.   The patient was advised to call back or seek an in-person evaluation if the symptoms worsen or if the condition fails to improve as anticipated.  I provided 15 minutes of non-face-to-face time during this encounter.   Vicente Males, LPN    Review of Systems  Constitutional: Negative for activity change and appetite change.  HENT: Negative for  congestion and rhinorrhea.   Respiratory: Negative for cough and shortness of breath.   Cardiovascular: Negative for chest pain and leg swelling.  Gastrointestinal: Negative for abdominal pain, nausea and vomiting.  Skin: Negative for color change.  Neurological: Negative for dizziness and weakness.  Psychiatric/Behavioral: Positive for depression. Negative for agitation and confusion.       Objective:   Physical Exam  Today's visit was via telephone Physical exam was not possible for this visit       Assessment & Plan:  Patient with moderate social phobia as well as depression she is trying to get herself under his best control as possible she feels like the medicine is helping some but not a lot we talked about how we could increase the strength of the medicine so therefore we will increase under 50 mg daily she will give [place] feedback in 3 to 4 weeks how she is doing and follow-up in 4 months  Patient has been offered counseling she is deferred

## 2019-03-01 ENCOUNTER — Other Ambulatory Visit: Payer: Self-pay | Admitting: Nurse Practitioner

## 2019-03-22 ENCOUNTER — Encounter: Payer: Self-pay | Admitting: Nurse Practitioner

## 2019-03-22 ENCOUNTER — Ambulatory Visit: Payer: 59 | Admitting: Nurse Practitioner

## 2019-03-22 VITALS — BP 142/84 | HR 70 | Temp 97.8°F | Ht 65.0 in | Wt 198.0 lb

## 2019-03-22 DIAGNOSIS — R194 Change in bowel habit: Secondary | ICD-10-CM | POA: Diagnosis not present

## 2019-03-22 DIAGNOSIS — R110 Nausea: Secondary | ICD-10-CM | POA: Diagnosis not present

## 2019-03-22 DIAGNOSIS — K219 Gastro-esophageal reflux disease without esophagitis: Secondary | ICD-10-CM | POA: Diagnosis not present

## 2019-03-22 NOTE — Patient Instructions (Addendum)
If you are age 37 or older, your body mass index should be between 23-30. Your Body mass index is 32.95 kg/m. If this is out of the aforementioned range listed, please consider follow up with your Primary Care Provider.  If you are age 23 or younger, your body mass index should be between 19-25. Your Body mass index is 32.95 kg/m. If this is out of the aformentioned range listed, please consider follow up with your Primary Care Provider.   Continue as needed Bentyl, Zofran.  Continue fiber.  Follow up as needed.  Thank you for choosing me and [facility].   Tye Savoy, NP

## 2019-03-22 NOTE — Progress Notes (Signed)
Assessment and plan reviewed 

## 2019-03-22 NOTE — Progress Notes (Signed)
Chief Complaint:    Follow up  IMPRESSION and PLAN:     1.   38 yo female with longstanding (years) history of intermittent nausea, bloating sporadic loose stool with cramping. Addition of fiber has improved bowel movements. About once a week still gets loose stool / cramps but Levsin helps within 20 minutes. Also the Zofran helps intermittent nausea which also occurs about once a week. Tries to avoid trigger foods.  -follow up as needed. For now symptoms under control.   2. GERD. Heartburn better with lifestyle modifications and PPI.  -Continue anti-reflux measures and PPI. If stays asymptomatic would like to see if she could de-escalate GERD therapy to H2 blockers or maybe control with diet and anti-reflux measures.   HPI:     Patient is a 38 yo female who I saw late July for GERD, chronic intermittent bloating, nausea, altered bowel habits with cramps. Recommended Citrucel, Levsin as neeeded and Zofran as needed. Discussed anti-reflux measures and PPI which has recently been started by PCP was continued.  Doris Foster is here for follow up. She feels like things are going much better. She is taking the fiber everyday and episodes of liquid stools have become less. About once a week she still gets the lower abdominal cramping and nausea but symptoms resolved with Levsin and Zofran. Compliant with daily Protonix, takes it prior to dinner as most of her symptoms occur in the evening.She thinks some some of the lifestyle such as going to bed on any empty stomach have helped as well.  Her weight is stable.   Review of systems:     No chest pain, no SOB, no fevers, no urinary sx   Past Medical History:  Diagnosis Date  . Anxiety   . Depression   . History of cold sores   . Seasonal allergies     Patient's surgical history, family medical history, social history, medications and allergies were all reviewed in Epic     Current Outpatient Medications  Medication Sig Dispense Refill  .  fexofenadine-pseudoephedrine (ALLEGRA-D 24) 180-240 MG 24 hr tablet Take 1 tablet by mouth daily. As needed.    Marland Kitchen HYDROcodone-acetaminophen (NORCO/VICODIN) 5-325 MG tablet One tablet every four hours as needed for acute pain.  Limit of five days per [place]. 30 tablet 0  . hyoscyamine (LEVSIN SL) 0.125 MG SL tablet Place 1 tablet (0.125 mg total) under the tongue 2 (two) times a day. Take every 12 hours 30 tablet 2  . naproxen (NAPROSYN) 500 MG tablet Take 500 mg by mouth. Once or twice a week as needed    . ondansetron (ZOFRAN) 4 MG tablet Take 1 tablet (4 mg total) by mouth every 8 (eight) hours as needed for nausea or vomiting. 20 tablet 2  . pantoprazole (PROTONIX) 40 MG tablet Take 40 mg by mouth daily.    . sertraline (ZOLOFT) 100 MG tablet Take 1 1/2 tablets  po q day 45 tablet 6  . valACYclovir (VALTREX) 1000 MG tablet TAKE 1 TABLET DAILY FOR SUPPRESSION 30 tablet 11   No current facility-administered medications for this visit.     Physical Exam:     BP (!) 142/84   Pulse 70   Temp 97.8 F (36.6 C) (Oral)   Ht 5\' 5"  (1.651 m)   Wt 198 lb (89.8 kg)   BMI 32.95 kg/m   GENERAL:  Pleasant female in NAD PSYCH: : Cooperative, normal affect EENT:  conjunctiva pink,  mucous membranes moist, neck supple without masses CARDIAC:  RRR, no murmur heard, no peripheral edema PULM: Normal respiratory effort, lungs CTA bilaterally, no wheezing ABDOMEN:  Nondistended, soft, nontender. No obvious masses, no hepatomegaly,  normal bowel sounds SKIN:  turgor, no lesions seen Musculoskeletal:  Normal muscle tone, normal strength NEURO: Alert and oriented x 3, no focal neurologic deficits   Willette ClusterPaula Guenther , NP 03/22/2019, 2:13 PM

## 2019-04-19 ENCOUNTER — Other Ambulatory Visit: Payer: Self-pay | Admitting: Nurse Practitioner

## 2019-04-19 ENCOUNTER — Other Ambulatory Visit: Payer: Self-pay | Admitting: Family Medicine

## 2019-08-27 ENCOUNTER — Ambulatory Visit: Payer: 59

## 2019-08-27 ENCOUNTER — Other Ambulatory Visit: Payer: Self-pay

## 2019-08-27 ENCOUNTER — Ambulatory Visit (INDEPENDENT_AMBULATORY_CARE_PROVIDER_SITE_OTHER): Payer: 59 | Admitting: Orthopedic Surgery

## 2019-08-27 ENCOUNTER — Encounter: Payer: Self-pay | Admitting: Orthopedic Surgery

## 2019-08-27 VITALS — BP 131/90 | HR 75 | Ht 65.0 in | Wt 203.0 lb

## 2019-08-27 DIAGNOSIS — G8929 Other chronic pain: Secondary | ICD-10-CM

## 2019-08-27 DIAGNOSIS — M25562 Pain in left knee: Secondary | ICD-10-CM

## 2019-08-27 MED ORDER — MELOXICAM 7.5 MG PO TABS: 7.5000 mg | ORAL_TABLET | Freq: Every day | ORAL | 5 refills | Status: DC

## 2019-08-27 NOTE — Patient Instructions (Addendum)
Patellofemoral Pain Syndrome Patellofemoral or kneecap pain syndrome is best managed with physical therapy to strengthen the muscles around the kneecap and with anti-inflammatories and activity modification which includes avoiding as much as possible squatting kneeling bending climbing or descending stairs We are going to start you on meloxicam 1 daily  You can also use muscle creams and topical medications over-the-counter and supplement this with 500 mg of Tylenol every 6 hours  Patellofemoral pain syndrome is a condition in which the tissue (cartilage) on the underside of the kneecap (patella) softens or breaks down. This causes pain in the front of the knee. The condition is also called runner's knee or chondromalacia patella. Patellofemoral pain syndrome is most common in young adults who are active in sports. The knee is the largest joint in the body. The patella covers the front of the knee and is attached to muscles above and below the knee. The underside of the patella is covered with a smooth type of cartilage (synovium). The smooth surface helps the patella to glide easily when you move your knee. Patellofemoral pain syndrome causes swelling in the joint linings and bone surfaces in the knee. What are the causes? This condition may be caused by:  Overuse of the knee.  Poor alignment of your knee joints.  Weak leg muscles.  A direct blow to your kneecap. What increases the risk? You are more likely to develop this condition if:  You do a lot of activities that can wear down your kneecap. These include: ? Running. ? Squatting. ? Climbing stairs.  You start a new physical activity or exercise program.  You wear shoes that do not fit well.  You do not have good leg strength.  You are overweight. What are the signs or symptoms? The main symptom of this condition is knee pain. This may feel like a dull, aching pain underneath your patella, in the front of your knee. There may  be a popping or cracking sound when you move your knee. Pain may get worse with:  Exercise.  Climbing stairs.  Running.  Jumping.  Squatting.  Kneeling.  Sitting for a long time.  Moving or pushing on your patella. How is this diagnosed? This condition may be diagnosed based on:  Your symptoms and medical history. You may be asked about your recent physical activities and which ones cause knee pain.  A physical exam. This may include: ? Moving your patella back and forth. ? Checking your range of knee motion. ? Having you squat or jump to see if you have pain. ? Checking the strength of your leg muscles.  Imaging tests to confirm the diagnosis. These may include an MRI of your knee. How is this treated? This condition may be treated at home with rest, ice, compression, and elevation (RICE).  Other treatments may include:  Nonsteroidal anti-inflammatory drugs (NSAIDs).  Physical therapy to stretch and strengthen your leg muscles.  Shoe inserts (orthotics) to take stress off your knee.  A knee brace or knee support.  Adhesive tapes to the skin.  Surgery to remove damaged cartilage or move the patella to a better position. This is rare. Follow these instructions at home: If you have a shoe or brace:  Wear the shoe or brace as told by your health care provider. Remove it only as told by your health care provider.  Loosen the shoe or brace if your toes tingle, become numb, or turn cold and blue.  Keep the shoe or brace clean.  If the shoe or brace is not waterproof: ? Do not let it get wet. ? Cover it with a watertight covering when you take a bath or a shower. Managing pain, stiffness, and swelling  If directed, put ice on the painful area. ? If you have a removable shoe or brace, remove it as told by your health care provider. ? Put ice in a plastic bag. ? Place a towel between your skin and the bag. ? Leave the ice on for 20 minutes, 2-3 times a  day.  Move your toes often to avoid stiffness and to lessen swelling.  Rest your knee: ? Avoid activities that cause knee pain. ? When sitting or lying down, raise (elevate) the injured area above the level of your heart, whenever possible. General instructions  Take over-the-counter and prescription medicines only as told by your health care provider.  Use splints, braces, knee supports, or walking aids as directed by your health care provider.  Perform stretching and strengthening exercises as told by your health care provider or physical therapist.  Do not use any products that contain nicotine or tobacco, such as cigarettes and e-cigarettes. These can delay healing. If you need help quitting, ask your health care provider.  Return to your normal activities as told by your health care provider. Ask your health care provider what activities are safe for you.  Keep all follow-up visits as told by your health care provider. This is important. Contact a health care provider if:  Your symptoms get worse.  You are not improving with home care. Summary  Patellofemoral pain syndrome is a condition in which the tissue (cartilage) on the underside of the kneecap (patella) softens or breaks down.  This condition causes swelling in the joint linings and bone surfaces in the knee. This leads to pain in the front of the knee.  This condition may be treated at home with rest, ice, compression, and elevation (RICE).  Use splints, braces, knee supports, or walking aids as directed by your health care provider. This information is not intended to replace advice given to you by your health care provider. Make sure you discuss any questions you have with your health care provider. Document Revised: 08/25/2017 Document Reviewed: 08/25/2017 Elsevier Patient Education  2020 [employer].

## 2019-08-27 NOTE — Progress Notes (Signed)
Doris Foster  08/27/2019  Body mass index is 33.78 kg/m.   HISTORY SECTION :  Chief Complaint  Patient presents with  . Knee Pain    left    39 year old female last seen here 2 years ago by Dr. Hilda Lias apparently she received an injection also had NSAID therapy presents now with left knee pain.      Review of Systems  Constitutional: Negative for chills and fever.  Respiratory: Negative for shortness of breath.   Cardiovascular: Negative for chest pain.  Skin: Negative.      has a past medical history of Anxiety, Depression, History of cold sores, and Seasonal allergies.   Past Surgical History:  Procedure Laterality Date  . NO PAST SURGERIES      Body mass index is 33.78 kg/m.   No Known Allergies   Current Outpatient Medications:  .  hyoscyamine (LEVSIN SL) 0.125 MG SL tablet, PLACE 1 TABLET (0.125 MG TOTAL) UNDER THE TONGUE 2 (TWO) TIMES A DAY. TAKE EVERY 12 HOURS, Disp: 30 tablet, Rfl: 2 .  naproxen (NAPROSYN) 500 MG tablet, Take 500 mg by mouth. Once or twice a week as needed, Disp: , Rfl:  .  ondansetron (ZOFRAN) 4 MG tablet, Take 1 tablet (4 mg total) by mouth every 8 (eight) hours as needed for nausea or vomiting., Disp: 20 tablet, Rfl: 2 .  valACYclovir (VALTREX) 1000 MG tablet, TAKE 1 TABLET DAILY FOR SUPPRESSION, Disp: 30 tablet, Rfl: 11 .  meloxicam (MOBIC) 7.5 MG tablet, Take 1 tablet (7.5 mg total) by mouth daily., Disp: 30 tablet, Rfl: 5   PHYSICAL EXAM SECTION: 1) BP 131/90   Pulse 75   Ht 5\' 5"  (1.651 m)   Wt 203 lb (92.1 kg)   LMP 08/13/2019 (Approximate)   BMI 33.78 kg/m   Body mass index is 33.78 kg/m. General appearance: Well-developed well-nourished no gross deformities  2) Cardiovascular normal pulse and perfusion , normal color   3) Neurologically deep tendon reflexes are equal and normal, no sensation loss or deficits no pathologic reflexes  4) Psychological: Awake alert and oriented x3 mood and affect normal  5) Skin no  lacerations or ulcerations no nodularity no palpable masses, no erythema or nodularity  6) Musculoskeletal:   Left knee no tenderness normal range of motion knee is stable muscle tone is normal popliteal angles about 45 degrees  Right knee tenderness and grinding in the patellofemoral region range of motion is normal no effusion all ligaments are stable patella shows no excessive motion straight leg raise was negative popliteal angle 45 degrees   MEDICAL DECISION MAKING  A.  Encounter Diagnosis  Name Primary?  . Chronic pain of left knee, chondromalacia left patella Yes    B. DATA ANALYSED:  IMAGING: Independent interpretation of images: No  Office x-rays normal Orders: Physical therapy  Outside records reviewed: None  C. MANAGEMENT  Patient education Anti-inflammatories Physical therapy Follow-up as needed   Meds ordered this encounter  Medications  . meloxicam (MOBIC) 7.5 MG tablet    Sig: Take 1 tablet (7.5 mg total) by mouth daily.    Dispense:  30 tablet    Refill:  5      08/15/2019, MD  08/27/2019 9:54 AM

## 2019-09-01 ENCOUNTER — Ambulatory Visit: Payer: 59 | Admitting: Orthopedic Surgery

## 2019-10-10 ENCOUNTER — Other Ambulatory Visit: Payer: Self-pay | Admitting: Orthopedic Surgery

## 2019-10-16 ENCOUNTER — Other Ambulatory Visit: Payer: Self-pay | Admitting: Family Medicine

## 2019-12-24 ENCOUNTER — Other Ambulatory Visit: Payer: Self-pay | Admitting: Obstetrics & Gynecology

## 2020-01-05 ENCOUNTER — Ambulatory Visit
Admission: EM | Admit: 2020-01-05 | Discharge: 2020-01-05 | Disposition: A | Discharge status: Home / Self Care | Payer: 59 | Attending: Emergency Medicine | Admitting: Emergency Medicine

## 2020-01-05 ENCOUNTER — Other Ambulatory Visit: Payer: Self-pay

## 2020-01-05 DIAGNOSIS — J069 Acute upper respiratory infection, unspecified: Secondary | ICD-10-CM

## 2020-01-05 DIAGNOSIS — Z1152 Encounter for screening for COVID-19: Secondary | ICD-10-CM | POA: Diagnosis not present

## 2020-01-05 MED ORDER — FLUTICASONE PROPIONATE 50 MCG/ACT NA SUSP
1.0000 | Freq: Every day | NASAL | 0 refills | Status: DC
Start: 2020-01-05 — End: 2020-04-17

## 2020-01-05 MED ORDER — CETIRIZINE HCL 10 MG PO TABS: 10.0000 mg | ORAL_TABLET | Freq: Every day | ORAL | 0 refills | Status: DC

## 2020-01-05 MED ORDER — BENZONATATE 100 MG PO CAPS
100.0000 mg | ORAL_CAPSULE | Freq: Three times a day (TID) | ORAL | 0 refills | Status: DC
Start: 2020-01-05 — End: 2020-04-17

## 2020-01-05 MED ORDER — PREDNISONE 10 MG PO TABS
20.0000 mg | ORAL_TABLET | Freq: Every day | ORAL | 0 refills | Status: AC
Start: 2020-01-05 — End: 2020-01-10

## 2020-01-05 NOTE — Discharge Instructions (Addendum)
COVID testing ordered.  It will take between 2-7 days for test results.  Someone will contact you regarding abnormal results.    In the meantime: You should remain isolated in your home for 10 days from symptom onset AND greater than 24 hours after symptoms resolution (absence of fever without the use of fever-reducing medication and improvement in respiratory symptoms), whichever is longer Get plenty of rest and push fluids Tessalon Perles prescribed for cough Prescribed yrtec for nasal congestion, runny nose, and/or sore throat Prescribed flonase for nasal congestion and runny nose Use medications daily for symptom relief Use OTC medications like ibuprofen or tylenol as needed fever or pain Call or go to the ED if you have any new or worsening symptoms such as fever, worsening cough, shortness of breath, chest tightness, chest pain, turning blue, changes in mental status, etc..Marland Kitchen

## 2020-01-05 NOTE — ED Triage Notes (Signed)
Headache, sinus drainage, sore throat since Sunday. Mucinex DM and Dayquil helping some.

## 2020-01-05 NOTE — ED Provider Notes (Signed)
[facility]   [id number] 01/05/20 Arrival Time: 1529   Chief Complaint  Patient presents with   Nasal Congestion   Sore Throat    SUBJECTIVE: History from: patient.  Doris Foster is a 39 y.o. female who presents the urgent care for complaint of headache, sinus drainage, cough, congestion and sore throat for the past 3 days.  Denies sick exposure to COVID, flu or strep.  Denies recent travel.  Has tried OTC medication with mild relief.  Denies previous symptoms in the past.   Denies fever, chills, fatigue, sinus pain, rhinorrhea,  SOB, wheezing, chest pain, nausea, changes in bowel or bladder habits.       Received flu shot this year: no.  ROS: As per HPI.  All other pertinent ROS negative.     Past Medical History:  Diagnosis Date   Anxiety    Depression    History of cold sores    Seasonal allergies    Past Surgical History:  Procedure Laterality Date   NO PAST SURGERIES     No Known Allergies No current facility-administered medications on file prior to encounter.   Current Outpatient Medications on File Prior to Encounter  Medication Sig Dispense Refill   meloxicam (MOBIC) 7.5 MG tablet Take 1 tablet (7.5 mg total) by mouth daily. 30 tablet 5   valACYclovir (VALTREX) 1000 MG tablet TAKE 1 TABLET BY MOUTH EVERY DAY FOR SUPPRESSION 90 tablet 3   Social History   Socioeconomic History   Marital status: Married    Spouse name: Not on file   Number of children: 2   Years of education: Not on file   Highest education level: Not on file  Occupational History   Occupation: Air traffic controller in [place]  Tobacco Use   Smoking status: Current Some Day Smoker    Packs/day: 0.25    Years: 3.00    Pack years: 0.75    Types: Cigarettes   Smokeless tobacco: Never Used  Substance and Sexual Activity   Alcohol use: Yes    Comment: occas social   Drug use: No   Sexual activity: Yes    Partners: Male    Birth control/protection: Condom   Other Topics Concern   Not on file  Social History Narrative   Not on file   Social Determinants of Health   Financial Resource Strain:    Difficulty of Paying Living Expenses:   Food Insecurity:    Worried About Programme researcher, broadcasting/film/video in the Last Year:    Barista in the Last Year:   Transportation Needs:    Freight forwarder (Medical):    Lack of Transportation (Non-Medical):   Physical Activity:    Days of Exercise per Week:    Minutes of Exercise per Session:   Stress:    Feeling of Stress :   Social Connections:    Frequency of Communication with Friends and Family:    Frequency of Social Gatherings with Friends and Family:    Attends Religious Services:    Active Member of Clubs or Organizations:    Attends Engineer, structural:    Marital Status:   Intimate Partner Violence:    Fear of Current or Ex-Partner:    Emotionally Abused:    Physically Abused:    Sexually Abused:    Family History  Problem Relation Age of Onset   Heart disease Father    Dementia Father    Cancer Maternal Grandmother        ?  lung-non-smoker   Irritable bowel syndrome Paternal Aunt        runs on paternal side   Colon cancer Neg Hx    Rectal cancer Neg Hx    Esophageal cancer Neg Hx     OBJECTIVE:  Vitals:   01/05/20 1537  BP: (!) 139/95  Pulse: 70  Resp: 15  Temp: 98.7 F (37.1 C)  TempSrc: Oral  SpO2: 98%     General appearance: alert; appears fatigued, but nontoxic; speaking in full sentences and tolerating own secretions HEENT: NCAT; Ears: EACs clear, TMs pearly gray; Eyes: PERRL.  EOM grossly intact. Sinuses: nontender; Nose: nares patent without rhinorrhea, Throat: oropharynx clear, tonsils non erythematous or enlarged, uvula midline  Neck: supple without LAD Lungs: unlabored respirations, symmetrical air entry; cough: mild; no respiratory distress; CTAB Heart: regular rate and rhythm.  Radial pulses 2+ symmetrical  bilaterally Skin: warm and dry Psychological: alert and cooperative; normal mood and affect  LABS:  No results found for this or any previous visit (from the past 24 hour(s)).   ASSESSMENT & PLAN:  1. URI with cough and congestion   2. Encounter for screening for COVID-19     Meds ordered this encounter  Medications   benzonatate (TESSALON) 100 MG capsule    Sig: Take 1 capsule (100 mg total) by mouth every 8 (eight) hours.    Dispense:  30 capsule    Refill:  0   cetirizine (ZYRTEC ALLERGY) 10 MG tablet    Sig: Take 1 tablet (10 mg total) by mouth daily.    Dispense:  30 tablet    Refill:  0   fluticasone (FLONASE) 50 MCG/ACT nasal spray    Sig: Place 1 spray into both nostrils daily for 14 days.    Dispense:  16 g    Refill:  0    Discharge instructions  COVID testing ordered.  It will take between 2-7 days for test results.  Someone will contact you regarding abnormal results.    In the meantime: You should remain isolated in your home for 10 days from symptom onset AND greater than 24 hours after symptoms resolution (absence of fever without the use of fever-reducing medication and improvement in respiratory symptoms), whichever is longer Get plenty of rest and push fluids Tessalon Perles prescribed for cough Prescribed yrtec for nasal congestion, runny nose, and/or sore throat Prescribed flonase for nasal congestion and runny nose Use medications daily for symptom relief Use OTC medications like ibuprofen or tylenol as needed fever or pain Call or go to the ED if you have any new or worsening symptoms such as fever, worsening cough, shortness of breath, chest tightness, chest pain, turning blue, changes in mental status, etc...   Reviewed expectations re: course of current medical issues. Questions answered. Outlined signs and symptoms indicating need for more acute intervention. Patient verbalized understanding. After Visit Summary given.           Emerson Monte, [place] 01/05/20 [phone]

## 2020-01-06 LAB — NOVEL CORONAVIRUS, NAA: SARS-CoV-2, NAA: NOT DETECTED

## 2020-01-06 LAB — SARS-COV-2, NAA 2 DAY TAT

## 2020-04-17 ENCOUNTER — Encounter: Payer: Self-pay | Admitting: Family Medicine

## 2020-04-17 ENCOUNTER — Other Ambulatory Visit: Payer: Self-pay

## 2020-04-17 ENCOUNTER — Ambulatory Visit: Payer: 59 | Admitting: Family Medicine

## 2020-04-17 VITALS — BP 118/74 | HR 87 | Temp 97.2°F | Ht 65.0 in | Wt 205.0 lb

## 2020-04-17 DIAGNOSIS — F329 Major depressive disorder, single episode, unspecified: Secondary | ICD-10-CM

## 2020-04-17 DIAGNOSIS — F419 Anxiety disorder, unspecified: Secondary | ICD-10-CM | POA: Diagnosis not present

## 2020-04-17 DIAGNOSIS — R5383 Other fatigue: Secondary | ICD-10-CM | POA: Diagnosis not present

## 2020-04-17 DIAGNOSIS — F32A Depression, unspecified: Secondary | ICD-10-CM

## 2020-04-17 DIAGNOSIS — Z13220 Encounter for screening for lipoid disorders: Secondary | ICD-10-CM | POA: Diagnosis not present

## 2020-04-17 MED ORDER — SERTRALINE HCL 100 MG PO TABS: ORAL_TABLET | ORAL | 5 refills | Status: DC

## 2020-04-17 NOTE — Progress Notes (Signed)
   Subjective:    Patient ID: Doris Foster, female    DOB: 1981/02/02, 39 y.o.   MRN: [id number]  HPIpt was on sertraline and stopped for a long time she states she is not sure how long ago she stopped. She stopped it because it made her sleepy and started herself back on it about one month ago.   Declines flu vaccine.  Patient under a lot of stress finds her self feeling depressed denies being suicidal.  Anxious at times.  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  HENT: Negative for congestion and rhinorrhea.   Respiratory: Negative for cough and shortness of breath.   Cardiovascular: Negative for chest pain and leg swelling.  Gastrointestinal: Negative for abdominal pain and diarrhea.  Endocrine: Negative for polydipsia and polyphagia.  Skin: Negative for color change.  Neurological: Negative for dizziness and weakness.  Psychiatric/Behavioral: Negative for behavioral problems and confusion.       Objective:   Physical Exam Vitals reviewed.  Constitutional:      General: She is not in acute distress. HENT:     Head: Normocephalic.  Cardiovascular:     Rate and Rhythm: Normal rate and regular rhythm.     Heart sounds: Normal heart sounds. No murmur heard.   Pulmonary:     Effort: Pulmonary effort is normal.     Breath sounds: Normal breath sounds.  Lymphadenopathy:     Cervical: No cervical adenopathy.  Neurological:     Mental Status: She is alert.  Psychiatric:        Behavior: Behavior normal.     Patient denies being suicidal She is looking into trying to find a job that is closer to her home and has less travel We did talk about relaxation techniques talked about trying to fit in activity in time together with her husband    Assessment & Plan:  1. Screening for lipid disorders Screening - Lipid panel  2. Other  fatigue Screening - Basic metabolic panel Mild depression.  We did talk about medication she did well in the past with sertraline she would like to reinitiate that.  She is currently taking 100 mg 1-1/2 daily we went ahead and refill this she will give [place] a follow-up again in 4 to 6 weeks and she will do another follow-up after that in 4 months

## 2020-05-12 ENCOUNTER — Other Ambulatory Visit: Payer: Self-pay | Admitting: Family Medicine

## 2020-09-05 ENCOUNTER — Telehealth: Payer: Self-pay | Admitting: Obstetrics & Gynecology

## 2020-09-13 NOTE — Telephone Encounter (Signed)
Open in error

## 2020-09-14 ENCOUNTER — Other Ambulatory Visit: Payer: Self-pay

## 2020-09-15 LAB — BASIC METABOLIC PANEL
BUN/Creatinine Ratio: 21 (ref 9–23)
BUN: 15 mg/dL (ref 6–20)
CO2: 21 mmol/L (ref 20–29)
Calcium: 9.1 mg/dL (ref 8.7–10.2)
Chloride: 105 mmol/L (ref 96–106)
Creatinine, Ser: 0.71 mg/dL (ref 0.57–1.00)
GFR calc Af Amer: 124 mL/min/{1.73_m2} (ref >59)
GFR calc non Af Amer: 108 mL/min/{1.73_m2} (ref >59)
Glucose: 114 mg/dL — ABNORMAL HIGH (ref 65–99)
Potassium: 4.6 mmol/L (ref 3.5–5.2)
Sodium: 143 mmol/L (ref 134–144)

## 2020-09-15 LAB — LIPID PANEL
Chol/HDL Ratio: 3.1 ratio (ref 0.0–4.4)
Cholesterol, Total: 160 mg/dL (ref 100–199)
HDL: 51 mg/dL (ref >39)
LDL Chol Calc (NIH): 99 mg/dL (ref 0–99)
Triglycerides: 50 mg/dL (ref 0–149)
VLDL Cholesterol Cal: 10 mg/dL (ref 5–40)

## 2020-09-25 ENCOUNTER — Ambulatory Visit: Payer: 59 | Admitting: Adult Health

## 2020-09-25 ENCOUNTER — Other Ambulatory Visit: Payer: Self-pay

## 2020-09-25 ENCOUNTER — Encounter: Payer: Self-pay | Admitting: Adult Health

## 2020-09-25 VITALS — BP 136/90 | HR 98 | Ht 64.5 in | Wt 219.0 lb

## 2020-09-25 DIAGNOSIS — I863 Vulval varices: Secondary | ICD-10-CM

## 2020-09-25 DIAGNOSIS — N8189 Other female genital prolapse: Secondary | ICD-10-CM | POA: Diagnosis not present

## 2020-09-25 NOTE — Progress Notes (Signed)
  Subjective:     Patient ID: Doris Foster, female   DOB: [birth year], 40 y.o.   MRN: [id number]  HPI [name] is a 40 year old white female, married, [id number] in complaining of ?clogged duct, and ?cervix down. Last pap was 09/07/2015. PCP is Dr Lilyan Punt  Review of Systems ?cloggged duct  ?cervix down Has stressful job Not happy at [employer] separate  Reviewed past medical,surgical, social and family history. Reviewed medications and allergies.     Objective:   Physical Exam BP 136/90 (BP Location: Left Arm, Patient Position: Sitting, Cuff Size: Large)   Pulse 98   Ht 5' 4.5" (1.638 m)   Wt 219 lb (99.3 kg)   LMP 09/15/2020   BMI 37.01 kg/m  Skin warm and dry.Pelvic: external genitalia is normal in appearance, has ?varicose vein inner left labia, (has dark purple round spot(, vagina: pink with good moisture,urethra has no lesions or masses noted, cervix:smooth and bulbous, uterus: normal size, shape and contour, non tender, no masses felt, has some mild pelvic relaxation, adnexa: no masses or tenderness noted. Bladder is non tender and no masses felt.  She saw spot with mirror. Examination chaperoned by Malachy Mood LPN AA is 10 Fall risk is low PHQ 9 score is 7, was on zoloft and stopped   GAD 7 score is 5, started back smoking  Upstream - 09/25/20 1521      Pregnancy Intention Screening   Does the patient want to become pregnant in the next year? No    Does the patient's partner want to become pregnant in the next year? No    Would the patient like to discuss contraceptive options today? No      Contraception Wrap Up   Current Method Female Condom    End Method Female Condom    Contraception Counseling Provided No          Assessment:     1. Varicose veins of vulva and perineum Leave area alone  2. Pelvic relaxation     Plan:     Return about 3/31 at 3 pm for pap and physical  mentioed to her to think about birth control, POP,IUD,Depo,

## 2020-10-26 ENCOUNTER — Other Ambulatory Visit (HOSPITAL_COMMUNITY)
Admission: RE | Admit: 2020-10-26 | Discharge: 2020-10-26 | Disposition: A | Discharge status: Home / Self Care | Payer: 59 | Source: Ambulatory Visit | Attending: Adult Health | Admitting: Adult Health

## 2020-10-26 ENCOUNTER — Ambulatory Visit (INDEPENDENT_AMBULATORY_CARE_PROVIDER_SITE_OTHER): Payer: 59 | Admitting: Adult Health

## 2020-10-26 ENCOUNTER — Other Ambulatory Visit: Payer: Self-pay

## 2020-10-26 ENCOUNTER — Encounter: Payer: Self-pay | Admitting: Adult Health

## 2020-10-26 VITALS — BP 126/91 | HR 89 | Ht 64.25 in | Wt 221.0 lb

## 2020-10-26 DIAGNOSIS — Z01419 Encounter for gynecological examination (general) (routine) without abnormal findings: Secondary | ICD-10-CM | POA: Insufficient documentation

## 2020-10-26 DIAGNOSIS — N8189 Other female genital prolapse: Secondary | ICD-10-CM

## 2020-10-26 NOTE — Progress Notes (Signed)
Patient ID: Doris Foster, female   DOB: 1980/08/20, 40 y.o.   MRN: [id number] History of Present Illness: Doris Foster is a 40 year old white female,married, G3P2012 in for a well woman gyn exam and pap. PCP is Dr Lilyan Punt.   Current Medications, Allergies, Past Medical History, Past Surgical History, Family History and Social History were reviewed in [employer] record.     Review of Systems:  Patient denies any headaches, hearing loss, fatigue, blurred vision, shortness of breath, chest pain, abdominal pain, problems with bowel movements, urination, or intercourse. No joint pain or mood swings.   Physical Exam:BP (!) 126/91 (BP Location: Left Arm, Patient Position: Sitting, Cuff Size: Large)   Pulse 89   Ht 5' 4.25" (1.632 m)   Wt 221 lb (100.2 kg)   LMP 10/12/2020 (Approximate)   BMI 37.64 kg/m  General:  Well developed, well nourished, no acute distress Skin:  Warm and dry Neck:  Midline trachea, normal thyroid, good ROM, no lymphadenopathy Lungs; Clear to auscultation bilaterally Breast:  No dominant palpable mass, retraction, or nipple discharge Cardiovascular: Regular rate and rhythm Abdomen:  Soft, non tender, no hepatosplenomegaly Pelvic:  External genitalia is normal in appearance, no lesions.  The vagina is normal in appearance,mild pelvic relaxation. Urethra has no lesions or masses. The cervix is smooth, pap with GC/CJL and HR HPV genotyping performed.  Uterus is felt to be normal size, shape, and contour.  No adnexal masses or tenderness noted.Bladder is non tender, no masses felt. Extremities/musculoskeletal:  No swelling or varicosities noted, no clubbing or cyanosis Psych:  No mood changes, alert and cooperative,seems happy AA is 5 Fall risk is low PHQ 9 score is 1 GAD 7 score is 2  Upstream - 10/26/20 1613      Pregnancy Intention Screening   Does the patient want to become pregnant in the next year? No    Does the patient's partner  want to become pregnant in the next year? No    Would the patient like to discuss contraceptive options today? No      Contraception Wrap Up   Current Method Female Condom    End Method Female Condom    Contraception Counseling Provided No         Examination chaperoned by Malachy Mood LPN  Impression and Plan: 1. Encounter for gynecological examination with Papanicolaou smear of cervix Pap sent Physical in 1 year Pap in 3 if normal Labs with PCP Mammogram at 41   2. Pelvic relaxation

## 2020-10-26 NOTE — Patient Instructions (Signed)

## 2020-10-30 LAB — CYTOLOGY - PAP
Chlamydia: NEGATIVE
Comment: NEGATIVE
Comment: NEGATIVE
Comment: NORMAL
Diagnosis: NEGATIVE
High risk HPV: NEGATIVE
Neisseria Gonorrhea: NEGATIVE

## 2020-11-30 ENCOUNTER — Encounter: Payer: Self-pay | Admitting: *Deleted

## 2020-11-30 ENCOUNTER — Telehealth: Payer: Self-pay | Admitting: Adult Health

## 2020-11-30 ENCOUNTER — Other Ambulatory Visit: Payer: Self-pay | Admitting: Adult Health

## 2020-11-30 NOTE — Telephone Encounter (Signed)
Mail box full @ 12:11pm. Will send MyChart message. JSY

## 2020-11-30 NOTE — Telephone Encounter (Signed)
Pt has decided she'd like to start oral BC  Please advise & notify pt    [facility], [place]

## 2020-12-01 ENCOUNTER — Other Ambulatory Visit: Payer: Self-pay | Admitting: Adult Health

## 2020-12-01 MED ORDER — NORETHINDRONE 0.35 MG PO TABS
1.0000 | ORAL_TABLET | Freq: Every day | ORAL | 11 refills | Status: DC
Start: 2020-12-01 — End: 2021-10-26

## 2020-12-01 NOTE — Progress Notes (Signed)
Rx micronor, she smokes

## 2020-12-05 DIAGNOSIS — J029 Acute pharyngitis, unspecified: Secondary | ICD-10-CM | POA: Insufficient documentation

## 2020-12-15 ENCOUNTER — Other Ambulatory Visit: Payer: Self-pay | Admitting: Adult Health

## 2020-12-15 MED ORDER — FLUCONAZOLE 150 MG PO TABS
ORAL_TABLET | ORAL | 1 refills | Status: DC
Start: 2020-12-15 — End: 2021-10-24

## 2020-12-15 NOTE — Progress Notes (Signed)
rx diflucan  

## 2021-01-20 ENCOUNTER — Other Ambulatory Visit: Payer: Self-pay | Admitting: Obstetrics & Gynecology

## 2021-10-24 ENCOUNTER — Encounter: Payer: Self-pay | Admitting: Nurse Practitioner

## 2021-10-24 ENCOUNTER — Ambulatory Visit: Payer: 59 | Admitting: Nurse Practitioner

## 2021-10-24 ENCOUNTER — Other Ambulatory Visit: Payer: Self-pay

## 2021-10-24 VITALS — BP 140/92 | HR 101 | Temp 97.4°F | Ht 64.25 in | Wt 215.0 lb

## 2021-10-24 DIAGNOSIS — F419 Anxiety disorder, unspecified: Secondary | ICD-10-CM

## 2021-10-24 DIAGNOSIS — I10 Essential (primary) hypertension: Secondary | ICD-10-CM

## 2021-10-24 DIAGNOSIS — F32A Depression, unspecified: Secondary | ICD-10-CM

## 2021-10-24 DIAGNOSIS — Z720 Tobacco use: Secondary | ICD-10-CM

## 2021-10-24 MED ORDER — LOSARTAN POTASSIUM 50 MG PO TABS: 25.0000 mg | ORAL_TABLET | Freq: Every day | ORAL | 0 refills | Status: DC

## 2021-10-24 MED ORDER — LOSARTAN POTASSIUM 25 MG PO TABS: 25.0000 mg | ORAL_TABLET | Freq: Every day | ORAL | 0 refills | Status: DC

## 2021-10-24 NOTE — Addendum Note (Signed)
Addended by: Deneise Lever on: 10/24/2021 05:02 PM ? ? Modules accepted: Orders ? ?
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
## 2021-10-26 ENCOUNTER — Other Ambulatory Visit: Payer: Self-pay | Admitting: Adult Health

## 2021-11-17 LAB — CMP14+EGFR
ALT: 18 IU/L (ref 0–32)
AST: 16 IU/L (ref 0–40)
Albumin/Globulin Ratio: 1.6 (ref 1.2–2.2)
Albumin: 4.4 g/dL (ref 3.8–4.8)
Alkaline Phosphatase: 120 IU/L (ref 44–121)
BUN/Creatinine Ratio: 16 (ref 9–23)
BUN: 12 mg/dL (ref 6–24)
Bilirubin Total: 0.4 mg/dL (ref 0.0–1.2)
CO2: 25 mmol/L (ref 20–29)
Calcium: 9.4 mg/dL (ref 8.7–10.2)
Chloride: 104 mmol/L (ref 96–106)
Creatinine, Ser: 0.75 mg/dL (ref 0.57–1.00)
Globulin, Total: 2.7 g/dL (ref 1.5–4.5)
Glucose: 102 mg/dL — ABNORMAL HIGH (ref 70–99)
Potassium: 5.3 mmol/L — ABNORMAL HIGH (ref 3.5–5.2)
Sodium: 141 mmol/L (ref 134–144)
Total Protein: 7.1 g/dL (ref 6.0–8.5)
eGFR: 103 mL/min/{1.73_m2} (ref >59)

## 2021-11-17 LAB — TSH+FREE T4
Free T4: 1.2 ng/dL (ref 0.82–1.77)
TSH: 0.936 u[IU]/mL (ref 0.450–4.500)

## 2021-11-21 ENCOUNTER — Encounter: Payer: Self-pay | Admitting: Nurse Practitioner

## 2021-11-21 ENCOUNTER — Ambulatory Visit (INDEPENDENT_AMBULATORY_CARE_PROVIDER_SITE_OTHER): Payer: Self-pay | Admitting: Nurse Practitioner

## 2021-11-21 VITALS — BP 120/89 | HR 79 | Temp 97.5°F | Ht 64.25 in | Wt 211.4 lb

## 2021-11-21 DIAGNOSIS — Z131 Encounter for screening for diabetes mellitus: Secondary | ICD-10-CM

## 2021-11-21 DIAGNOSIS — I10 Essential (primary) hypertension: Secondary | ICD-10-CM

## 2021-11-21 DIAGNOSIS — Z13220 Encounter for screening for lipoid disorders: Secondary | ICD-10-CM

## 2021-11-21 MED ORDER — LOSARTAN POTASSIUM 50 MG PO TABS: 50.0000 mg | ORAL_TABLET | Freq: Every day | ORAL | 0 refills | Status: DC
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
## 2021-12-13 ENCOUNTER — Other Ambulatory Visit: Payer: Self-pay | Admitting: Nurse Practitioner

## 2021-12-13 DIAGNOSIS — I10 Essential (primary) hypertension: Secondary | ICD-10-CM

## 2021-12-16 ENCOUNTER — Other Ambulatory Visit: Payer: Self-pay | Admitting: Nurse Practitioner

## 2021-12-16 DIAGNOSIS — I10 Essential (primary) hypertension: Secondary | ICD-10-CM

## 2021-12-19 ENCOUNTER — Other Ambulatory Visit (INDEPENDENT_AMBULATORY_CARE_PROVIDER_SITE_OTHER): Payer: Self-pay | Admitting: *Deleted

## 2021-12-19 VITALS — BP 111/78

## 2021-12-19 DIAGNOSIS — I10 Essential (primary) hypertension: Secondary | ICD-10-CM

## 2021-12-19 LAB — CMP14+EGFR
ALT: 29 IU/L (ref 0–32)
AST: 17 IU/L (ref 0–40)
Albumin/Globulin Ratio: 1.4 (ref 1.2–2.2)
Albumin: 4.4 g/dL (ref 3.8–4.8)
Alkaline Phosphatase: 129 IU/L — ABNORMAL HIGH (ref 44–121)
BUN/Creatinine Ratio: 17 (ref 9–23)
BUN: 12 mg/dL (ref 6–24)
Bilirubin Total: 0.3 mg/dL (ref 0.0–1.2)
CO2: 21 mmol/L (ref 20–29)
Calcium: 9.7 mg/dL (ref 8.7–10.2)
Chloride: 103 mmol/L (ref 96–106)
Creatinine, Ser: 0.69 mg/dL (ref 0.57–1.00)
Globulin, Total: 3.1 g/dL (ref 1.5–4.5)
Glucose: 89 mg/dL (ref 70–99)
Potassium: 4.6 mmol/L (ref 3.5–5.2)
Sodium: 143 mmol/L (ref 134–144)
Total Protein: 7.5 g/dL (ref 6.0–8.5)
eGFR: 112 mL/min/{1.73_m2} (ref >59)

## 2021-12-19 LAB — LIPID PANEL
Chol/HDL Ratio: 4.7 ratio — ABNORMAL HIGH (ref 0.0–4.4)
Cholesterol, Total: 165 mg/dL (ref 100–199)
HDL: 35 mg/dL — ABNORMAL LOW (ref >39)
LDL Chol Calc (NIH): 114 mg/dL — ABNORMAL HIGH (ref 0–99)
Triglycerides: 84 mg/dL (ref 0–149)
VLDL Cholesterol Cal: 16 mg/dL (ref 5–40)

## 2021-12-19 LAB — HEMOGLOBIN A1C
Est. average glucose Bld gHb Est-mCnc: 103 mg/dL
Hgb A1c MFr Bld: 5.2 % (ref 4.8–5.6)

## 2021-12-19 NOTE — Progress Notes (Signed)
Patient arrives for a blood pressure recheck after increasing Losartan for 25 mg to 50 mg daily. Patient states she thinks it is working well. BP today 111/78. Blood pressure showed to provider. Provider spoke with patient and advised follow up in 6 months.

## 2022-01-09 ENCOUNTER — Other Ambulatory Visit: Payer: Self-pay | Admitting: Nurse Practitioner

## 2022-01-09 DIAGNOSIS — I10 Essential (primary) hypertension: Secondary | ICD-10-CM

## 2022-02-05 ENCOUNTER — Other Ambulatory Visit: Payer: Self-pay | Admitting: Nurse Practitioner

## 2022-02-05 DIAGNOSIS — I10 Essential (primary) hypertension: Secondary | ICD-10-CM

## 2022-02-14 ENCOUNTER — Other Ambulatory Visit: Payer: Self-pay | Admitting: Nurse Practitioner

## 2022-02-14 DIAGNOSIS — I10 Essential (primary) hypertension: Secondary | ICD-10-CM

## 2022-02-16 ENCOUNTER — Other Ambulatory Visit: Payer: Self-pay | Admitting: Obstetrics & Gynecology

## 2022-06-26 ENCOUNTER — Ambulatory Visit: Payer: Self-pay | Admitting: Nurse Practitioner

## 2022-09-16 ENCOUNTER — Other Ambulatory Visit: Payer: Self-pay | Admitting: *Deleted

## 2022-09-16 DIAGNOSIS — I10 Essential (primary) hypertension: Secondary | ICD-10-CM

## 2022-09-16 MED ORDER — LOSARTAN POTASSIUM 50 MG PO TABS: 50.0000 mg | ORAL_TABLET | Freq: Every day | ORAL | 0 refills | Status: DC

## 2022-10-18 ENCOUNTER — Other Ambulatory Visit: Payer: Self-pay | Admitting: Family Medicine

## 2022-10-18 DIAGNOSIS — I10 Essential (primary) hypertension: Secondary | ICD-10-CM

## 2022-11-08 ENCOUNTER — Other Ambulatory Visit: Payer: Self-pay | Admitting: Family Medicine

## 2022-11-08 DIAGNOSIS — I10 Essential (primary) hypertension: Secondary | ICD-10-CM

## 2022-11-28 ENCOUNTER — Other Ambulatory Visit: Payer: Self-pay | Admitting: Family Medicine

## 2022-11-28 DIAGNOSIS — I10 Essential (primary) hypertension: Secondary | ICD-10-CM

## 2023-03-20 ENCOUNTER — Ambulatory Visit: Payer: BC Managed Care – PPO | Admitting: Nurse Practitioner

## 2023-03-20 ENCOUNTER — Encounter: Payer: Self-pay | Admitting: Nurse Practitioner

## 2023-03-20 VITALS — BP 133/86 | HR 79 | Temp 98.3°F | Ht 64.25 in | Wt 213.0 lb

## 2023-03-20 DIAGNOSIS — M25562 Pain in left knee: Secondary | ICD-10-CM

## 2023-03-20 DIAGNOSIS — G8929 Other chronic pain: Secondary | ICD-10-CM | POA: Diagnosis not present

## 2023-03-20 MED ORDER — VALACYCLOVIR HCL 1 G PO TABS: ORAL_TABLET | ORAL | 3 refills | Status: DC

## 2023-03-20 NOTE — Patient Instructions (Signed)
[facility] ([place]) Doris Foster [facility])

## 2023-03-20 NOTE — Progress Notes (Signed)
   Subjective:    Patient ID: Doris Foster, female    DOB: 1980/09/25, 42 y.o.   MRN: [id number]  HPI  Left knee painvflared up  1.5 month - has hx of pain and cannot bend back to an angle - walks to 3 to 6 mile per day  Has had chronic off-and-on pain in the left knee.  Saw orthopedic several years ago, got a shot in the knee and some naproxen.  Symptoms continued to flareup at times.  Has had x-rays done.  Symptoms worse over the past 1-1/2 months.  Has taken 3 doses of naproxen over the past couple of days with no relief.  Has to physically lift up her knee to get into the car and bathtub.  Extreme pain with certain movements.  Pain is mainly in the posterior part of the knee.  Mild edema. Also of note patient is not on any contraceptives.  Is with a female sexual partner.  States she is fine if pregnancy occurs.  Last BP outside the office 123/90.  Review of Systems  Respiratory:  Negative for cough, chest tightness and shortness of breath.   Cardiovascular:  Negative for chest pain.  Musculoskeletal:        Left knee pain       Objective:   Physical Exam NAD.  Alert, oriented.  Lungs clear.  Heart regular rate rhythm.  Left knee mild edema noted below the patella, no erythema or warmth.  Full passive ROM of the left knee with tenderness with full extension.  Mild joint laxity in the left knee as compared to the right. Today's Vitals   03/20/23 0903  BP: 133/86  Pulse: 79  Temp: 98.3 F (36.8 C)  SpO2: 98%  Weight: 213 lb (96.6 kg)  Height: 5' 4.25" (1.632 m)   Body mass index is 36.28 kg/m.        Assessment & Plan:   Problem List Items Addressed This Visit       Other   Chronic pain of left knee - Primary  Due to severity of her symptoms, recommend that she see orthopedic specialist.  Given names of 2 local specialist, patient will check to see if they are preferred on her insurance.  She should be able to call and make her own appointment but if any difficulties  contact our office. Patient does not take losartan on a regular basis only as needed if her blood pressure spikes.  Advised patient not to take losartan if pregnant. Meds ordered this encounter  Medications   valACYclovir (VALTREX) 1000 MG tablet    Sig: TAKE 1 TABLET BY MOUTH EVERY DAY FOR SUPPRESSION    Dispense:  90 tablet    Refill:  3    Order Specific Question:   Supervising Provider    Answer:   Lilyan Punt A [[id number]]   May continue Valtrex if needed.   Encouraged regular activity and weight loss of 5 to 10 pounds.   Also discussed smoking cessation especially considering possible pregnancy as well as her blood pressure.  Encourage patient to decrease the amount of smoking per day is much as possible. Encourage patient to get preventive health physical. Recheck here as needed.

## 2023-05-15 ENCOUNTER — Ambulatory Visit: Payer: BC Managed Care – PPO | Admitting: Nurse Practitioner

## 2023-05-15 ENCOUNTER — Encounter: Payer: Self-pay | Admitting: Nurse Practitioner

## 2023-05-15 VITALS — BP 126/86 | HR 69 | Temp 98.1°F | Ht 65.16 in | Wt 212.4 lb

## 2023-05-15 DIAGNOSIS — Z23 Encounter for immunization: Secondary | ICD-10-CM | POA: Diagnosis not present

## 2023-05-15 DIAGNOSIS — Z01411 Encounter for gynecological examination (general) (routine) with abnormal findings: Secondary | ICD-10-CM | POA: Diagnosis not present

## 2023-05-15 DIAGNOSIS — I10 Essential (primary) hypertension: Secondary | ICD-10-CM

## 2023-05-15 DIAGNOSIS — L089 Local infection of the skin and subcutaneous tissue, unspecified: Secondary | ICD-10-CM | POA: Diagnosis not present

## 2023-05-15 DIAGNOSIS — E669 Obesity, unspecified: Secondary | ICD-10-CM

## 2023-05-15 DIAGNOSIS — Z01419 Encounter for gynecological examination (general) (routine) without abnormal findings: Secondary | ICD-10-CM

## 2023-05-15 DIAGNOSIS — Z13228 Encounter for screening for other metabolic disorders: Secondary | ICD-10-CM

## 2023-05-15 DIAGNOSIS — B9689 Other specified bacterial agents as the cause of diseases classified elsewhere: Secondary | ICD-10-CM

## 2023-05-15 DIAGNOSIS — F17210 Nicotine dependence, cigarettes, uncomplicated: Secondary | ICD-10-CM

## 2023-05-15 DIAGNOSIS — Z13220 Encounter for screening for lipoid disorders: Secondary | ICD-10-CM

## 2023-05-15 DIAGNOSIS — Z1159 Encounter for screening for other viral diseases: Secondary | ICD-10-CM

## 2023-05-15 DIAGNOSIS — Z1231 Encounter for screening mammogram for malignant neoplasm of breast: Secondary | ICD-10-CM

## 2023-05-15 DIAGNOSIS — Z1329 Encounter for screening for other suspected endocrine disorder: Secondary | ICD-10-CM

## 2023-05-15 DIAGNOSIS — Z130 Encounter for screening for diseases of the blood and blood-forming organs and certain disorders involving the immune mechanism: Secondary | ICD-10-CM

## 2023-05-15 DIAGNOSIS — Z114 Encounter for screening for human immunodeficiency virus [HIV]: Secondary | ICD-10-CM

## 2023-05-15 MED ORDER — CLINDAMYCIN HCL 300 MG PO CAPS: 300.0000 mg | ORAL_CAPSULE | Freq: Three times a day (TID) | ORAL | 0 refills | Status: DC

## 2023-05-15 NOTE — Progress Notes (Unsigned)
Subjective:    Patient ID: Doris Foster, female    DOB: [birth year], 42 y.o.   MRN: [id number]  HPI The patient comes in today for a wellness visit.    A review of their health history was completed.  A review of medications was also completed.  Any needed refills; No  Eating habits: Fair; states her problem is eating at night due to her late hours getting home from work.  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  Specialist pt sees on regular basis: No  Preventative health issues were discussed.   Additional concerns: Sore area on the right lower abdomen.  Has been there for a couple of weeks.  Has drained at times and appears to be going away but comes back.  No fever.  Slightly tender to palpation.  Looks better today. Is not currently on any birth control.  Same female sexual partner for the past 3 years.  Does not use condoms.  Regular cycles about every 3 weeks with normal flow. Regular dental exams. Is due for an eye exam. Requests referral to dietitian due to difficulty losing weight despite limited eating. Continues to smoke 1/2 ppd or more.    Review of Systems  Constitutional:  Negative for activity change and appetite change.  HENT:  Negative for sore throat and trouble swallowing.   Respiratory:  Negative for cough, chest tightness, shortness of breath and wheezing.   Cardiovascular:  Negative for chest pain.  Gastrointestinal:  Positive for diarrhea and nausea. Negative for abdominal distention, abdominal pain, constipation and vomiting.       Long history of frequent soft BMs especially with stress and certain foods such as onions or peppers. No blood. No overt GERD symptoms. Nausea if she eats too much grease. No family history of colon cancer.   Genitourinary:  Negative for difficulty urinating, dysuria, enuresis, frequency, genital sores, menstrual problem, pelvic pain, urgency and  vaginal discharge.      05/15/2023    8:46 AM  Depression screen PHQ 2/9  Decreased Interest 1  Down, Depressed, Hopeless 2  PHQ - 2 Score 3  Altered sleeping 0  Tired, decreased energy 1  Change in appetite 0  Feeling bad or failure about yourself  1  Trouble concentrating 1  Moving slowly or fidgety/restless 0  Suicidal thoughts 0  PHQ-9 Score 6  Difficult doing work/chores Somewhat difficult      05/15/2023    8:47 AM 03/20/2023    9:45 AM 10/26/2020    4:18 PM 09/25/2020    3:21 PM  GAD 7 : Generalized Anxiety Score  Nervous, Anxious, on Edge 2 2 1 3   Control/stop worrying 1 1 0 1  Worry too much - different things 2 2 1 1   Trouble relaxing 1 2 0 0  Restless 0 0 0 0  Easily annoyed or irritable 3 1 0 0  Afraid - awful might happen 0 0 0 0  Total GAD 7 Score 9 8 2 5   Anxiety Difficulty Very difficult Very difficult            Objective:   Physical Exam Vitals and nursing note reviewed.  Constitutional:      General: She is not in acute distress.    Appearance: She is well-developed.  Neck:     Thyroid: No thyromegaly.     Trachea: No tracheal deviation.  Comments: Thyroid non tender to palpation. No mass or goiter noted.  Cardiovascular:     Rate and Rhythm: Normal rate and regular rhythm.     Heart sounds: Normal heart sounds. No murmur heard. Pulmonary:     Effort: Pulmonary effort is normal.     Breath sounds: Normal breath sounds.  Chest:  Breasts:    Right: No swelling, inverted nipple, mass, skin change or tenderness.     Left: No swelling, inverted nipple, mass, skin change or tenderness.  Abdominal:     General: There is no distension.     Palpations: Abdomen is soft.     Tenderness: There is no abdominal tenderness.  Genitourinary:    Comments: Defers GU exam and STD testing. Normal PAP 10/16/20. Musculoskeletal:     Cervical back: Normal range of motion and neck supple.  Lymphadenopathy:     Cervical: No cervical adenopathy.     Upper  Body:     Right upper body: No supraclavicular, axillary or pectoral adenopathy.     Left upper body: No supraclavicular, axillary or pectoral adenopathy.  Skin:    General: Skin is warm and dry.     Comments: Linear moderately erythematous healing wound right lower abdomen. Superficial. Mildly tender to touch. No active drainage.   Neurological:     Mental Status: She is alert and oriented to person, place, and time.  Psychiatric:        Mood and Affect: Mood normal.        Behavior: Behavior normal.        Thought Content: Thought content normal.        Judgment: Judgment normal.    Today's Vitals   05/15/23 0829  BP: 126/86  Pulse: 69  Temp: 98.1 F (36.7 C)  SpO2: 100%  Weight: 212 lb 6.4 oz (96.3 kg)  Height: 5' 5.16" (1.655 m)   Body mass index is 35.18 kg/m.         Assessment & Plan:   Problem List Items Addressed This Visit       Cardiovascular and Mediastinum   Primary hypertension   Relevant Orders   Comprehensive metabolic panel (Completed)   Lipid panel (Completed)   Referral to Nutrition and Diabetes Services     Musculoskeletal and Integument   Bacterial skin infection   Relevant Medications   clindamycin (CLEOCIN) 300 MG capsule   Other Relevant Orders   Tdap vaccine greater than or equal to 7yo IM (Completed)     Other   Cigarette smoker   Obesity (BMI 30-39.9)   Relevant Orders   Referral to Nutrition and Diabetes Services   Other Visit Diagnoses     Well woman exam    -  Primary   Relevant Orders   Comprehensive metabolic panel (Completed)   CBC with Differential/Platelet (Completed)   Lipid panel (Completed)   TSH (Completed)   HIV Antibody (routine testing w rflx) (Completed)   Hepatitis C antibody (Completed)   MM 3D SCREENING MAMMOGRAM BILATERAL BREAST   Screening for deficiency anemia       Relevant Orders   CBC with Differential/Platelet (Completed)   Screening for lipid disorders       Relevant Orders   Lipid panel  (Completed)   Screening for thyroid disorder       Relevant Orders   TSH (Completed)   Screening for HIV (human immunodeficiency virus)       Relevant Orders   HIV Antibody (routine testing w rflx) (  Completed)   Encounter for hepatitis C screening test for low risk patient       Relevant Orders   Hepatitis C antibody (Completed)   Screening for metabolic disorder       Relevant Orders   Comprehensive metabolic panel (Completed)   Encounter for screening mammogram for malignant neoplasm of breast       Relevant Orders   MM 3D SCREENING MAMMOGRAM BILATERAL BREAST   Immunization due       Relevant Orders   Tdap vaccine greater than or equal to 7yo IM (Completed)       Defers medication for anxiety or depression at this time. Meds ordered this encounter  Medications   clindamycin (CLEOCIN) 300 MG capsule    Sig: Take 1 capsule (300 mg total) by mouth 3 (three) times daily.    Dispense:  21 capsule    Refill:  0    Order Specific Question:   Supervising Provider    Answer:   Lilyan Punt A [[id number]]   Warm compresses to infection. Warning signs reviewed. Call back if worsens or persists.  Discussed options to assist with smoking cessation. Defers at this time but will call back if she decides to try medication. Labs ordered. Mammogram ordered.  Referred to dietician per her request. Return in about 1 year (around 05/14/2024) for physical.

## 2023-05-16 ENCOUNTER — Encounter: Payer: Self-pay | Admitting: Nurse Practitioner

## 2023-05-16 DIAGNOSIS — E669 Obesity, unspecified: Secondary | ICD-10-CM | POA: Insufficient documentation

## 2023-05-16 LAB — COMPREHENSIVE METABOLIC PANEL
ALT: 17 [IU]/L (ref 0–32)
AST: 16 [IU]/L (ref 0–40)
Albumin: 4.4 g/dL (ref 3.9–4.9)
Alkaline Phosphatase: 122 [IU]/L — ABNORMAL HIGH (ref 44–121)
BUN/Creatinine Ratio: 17 (ref 9–23)
BUN: 11 mg/dL (ref 6–24)
Bilirubin Total: 0.4 mg/dL (ref 0.0–1.2)
CO2: 23 mmol/L (ref 20–29)
Calcium: 9.3 mg/dL (ref 8.7–10.2)
Chloride: 103 mmol/L (ref 96–106)
Creatinine, Ser: 0.65 mg/dL (ref 0.57–1.00)
Globulin, Total: 2.6 g/dL (ref 1.5–4.5)
Glucose: 93 mg/dL (ref 70–99)
Potassium: 4.7 mmol/L (ref 3.5–5.2)
Sodium: 142 mmol/L (ref 134–144)
Total Protein: 7 g/dL (ref 6.0–8.5)
eGFR: 113 mL/min/{1.73_m2} (ref >59)

## 2023-05-16 LAB — LIPID PANEL
Chol/HDL Ratio: 3.8 {ratio} (ref 0.0–4.4)
Cholesterol, Total: 195 mg/dL (ref 100–199)
HDL: 52 mg/dL (ref >39)
LDL Chol Calc (NIH): 128 mg/dL — ABNORMAL HIGH (ref 0–99)
Triglycerides: 84 mg/dL (ref 0–149)
VLDL Cholesterol Cal: 15 mg/dL (ref 5–40)

## 2023-05-16 LAB — CBC WITH DIFFERENTIAL/PLATELET
Basophils Absolute: 0 10*3/uL (ref 0.0–0.2)
Basos: 0 %
EOS (ABSOLUTE): 0 10*3/uL (ref 0.0–0.4)
Eos: 1 %
Hematocrit: 38.4 % (ref 34.0–46.6)
Hemoglobin: 12.9 g/dL (ref 11.1–15.9)
Immature Grans (Abs): 0 10*3/uL (ref 0.0–0.1)
Immature Granulocytes: 0 %
Lymphocytes Absolute: 1.6 10*3/uL (ref 0.7–3.1)
Lymphs: 26 %
MCH: 32.7 pg (ref 26.6–33.0)
MCHC: 33.6 g/dL (ref 31.5–35.7)
MCV: 98 fL — ABNORMAL HIGH (ref 79–97)
Monocytes Absolute: 0.4 10*3/uL (ref 0.1–0.9)
Monocytes: 6 %
Neutrophils Absolute: 4.2 10*3/uL (ref 1.4–7.0)
Neutrophils: 67 %
Platelets: 333 10*3/uL (ref 150–450)
RBC: 3.94 x10E6/uL (ref 3.77–5.28)
RDW: 11.9 % (ref 11.7–15.4)
WBC: 6.1 10*3/uL (ref 3.4–10.8)

## 2023-05-16 LAB — HIV ANTIBODY (ROUTINE TESTING W REFLEX): HIV Screen 4th Generation wRfx: NONREACTIVE

## 2023-05-16 LAB — TSH: TSH: 0.657 u[IU]/mL (ref 0.450–4.500)

## 2023-05-16 LAB — HEPATITIS C ANTIBODY: Hep C Virus Ab: NONREACTIVE

## 2023-05-19 ENCOUNTER — Encounter (HOSPITAL_COMMUNITY): Payer: Self-pay

## 2023-05-19 ENCOUNTER — Encounter: Payer: Self-pay | Admitting: Nurse Practitioner

## 2023-05-19 ENCOUNTER — Ambulatory Visit (HOSPITAL_COMMUNITY)
Admission: RE | Admit: 2023-05-19 | Discharge: 2023-05-19 | Disposition: A | Discharge status: Home / Self Care | Payer: BC Managed Care – PPO | Source: Ambulatory Visit | Attending: Nurse Practitioner | Admitting: Nurse Practitioner

## 2023-05-19 DIAGNOSIS — F17210 Nicotine dependence, cigarettes, uncomplicated: Secondary | ICD-10-CM | POA: Insufficient documentation

## 2023-05-19 DIAGNOSIS — Z1231 Encounter for screening mammogram for malignant neoplasm of breast: Secondary | ICD-10-CM | POA: Diagnosis present

## 2023-05-19 DIAGNOSIS — N6489 Other specified disorders of breast: Secondary | ICD-10-CM | POA: Diagnosis not present

## 2023-05-19 DIAGNOSIS — L089 Local infection of the skin and subcutaneous tissue, unspecified: Secondary | ICD-10-CM | POA: Insufficient documentation

## 2023-05-21 ENCOUNTER — Other Ambulatory Visit (HOSPITAL_COMMUNITY): Payer: Self-pay | Admitting: Nurse Practitioner

## 2023-05-21 DIAGNOSIS — R928 Other abnormal and inconclusive findings on diagnostic imaging of breast: Secondary | ICD-10-CM

## 2023-06-09 NOTE — Telephone Encounter (Signed)
Called a patient , she had a recent wellness exam and was prescribed some antibiotics for what is a possible cyst in R side pelvic/groin area going on for 1.5 month . It is purple in appearance and drained a little blood last nigh, denies fever or chills. She has previously tried draining it herself in the past and believes it may be an ingrown hair or cyst, it is still bothering her she says, please advise.

## 2023-06-09 NOTE — Telephone Encounter (Signed)
Spoke with pt and informed per drs notes , appt scheduled

## 2023-06-10 ENCOUNTER — Ambulatory Visit: Payer: BC Managed Care – PPO | Admitting: Family Medicine

## 2023-06-10 VITALS — BP 124/80 | HR 85 | Ht 65.16 in | Wt 217.4 lb

## 2023-06-10 DIAGNOSIS — L02211 Cutaneous abscess of abdominal wall: Secondary | ICD-10-CM

## 2023-06-10 MED ORDER — DOXYCYCLINE HYCLATE 100 MG PO TABS: 100.0000 mg | ORAL_TABLET | Freq: Two times a day (BID) | ORAL | 0 refills | Status: DC

## 2023-06-10 NOTE — Progress Notes (Signed)
Patient with repetitive area in the right lower abdomen that creates a skin infection Was infected earlier this year and then again in the fall time and then recently became tender again with some drainage of pus Denies any injury to this area Denies having underlying immune deficiency Has small cystic area with 2 small drainage holes Patient states that it did drain a fair amount yesterday There is scar tissue around this area With patient consent area was cleansed, numbed with lidocaine with epi, a #11 blade was used to make a small cut Small amount of pus was obtained Dressing placed Doxycycline twice daily with snack in a tall glass of water Follow-up if progressive troubles or worse If having repetitive issues the next step would be referral to surgery to have this area removed patient aware she will send [place] MyChart update in the near future

## 2023-06-12 ENCOUNTER — Ambulatory Visit (HOSPITAL_COMMUNITY)
Admission: RE | Admit: 2023-06-12 | Discharge: 2023-06-12 | Disposition: A | Discharge status: Home / Self Care | Payer: BC Managed Care – PPO | Source: Ambulatory Visit | Attending: Nurse Practitioner | Admitting: Nurse Practitioner

## 2023-06-12 ENCOUNTER — Encounter (HOSPITAL_COMMUNITY): Payer: Self-pay

## 2023-06-12 DIAGNOSIS — R928 Other abnormal and inconclusive findings on diagnostic imaging of breast: Secondary | ICD-10-CM

## 2023-07-03 ENCOUNTER — Encounter: Payer: Self-pay | Admitting: Nutrition

## 2023-07-03 ENCOUNTER — Encounter: Payer: BC Managed Care – PPO | Attending: Nurse Practitioner | Admitting: Nutrition

## 2023-07-03 VITALS — Ht 65.0 in | Wt 216.0 lb

## 2023-07-03 DIAGNOSIS — I10 Essential (primary) hypertension: Secondary | ICD-10-CM | POA: Diagnosis present

## 2023-07-03 DIAGNOSIS — E669 Obesity, unspecified: Secondary | ICD-10-CM | POA: Insufficient documentation

## 2023-07-03 NOTE — Patient Instructions (Addendum)
Goals  Eat 3 meals per day- just  eat a piece of food for each meal Drink 1-2 bottles of water per day Work on looking for a new job to reduce her stress and find time to do better self care.Marland Kitchen

## 2023-07-03 NOTE — Progress Notes (Unsigned)
Medical Nutrition Therapy  Appointment Start time:  0800  Appointment End time:  0900  Primary concerns today: HTN and Obesity  Referral diagnosis: E66.9, I10.1 Preferred learning style: Read and see Learning readiness: Ready   NUTRITION ASSESSMENT  42 yr old female referred for HTN and Obesity.  PCP Dr. Gerda Diss and Sherie Don. Use to take Losartan but she felt it didn't make much different in her readings. THey were still elevated on the bottom number. BP 119/94 mg/dl 161/09 mg/dl    Clinical Medical Hx:  Past Medical History:  Diagnosis Date   Anxiety    Depression    History of cold sores    Seasonal allergies     Medications:  Current Outpatient Medications on File Prior to Visit  Medication Sig Dispense Refill   valACYclovir (VALTREX) 1000 MG tablet TAKE 1 TABLET BY MOUTH EVERY DAY FOR SUPPRESSION 90 tablet 3   doxycycline (VIBRA-TABS) 100 MG tablet Take 1 tablet (100 mg total) by mouth 2 (two) times daily. (Patient not taking: Reported on 07/03/2023) 14 tablet 0   losartan (COZAAR) 50 MG tablet TAKE 1 TABLET BY MOUTH EVERY DAY- NEEDS OFFICE VISIT (Patient not taking: Reported on 07/03/2023) 7 tablet 0   No current facility-administered medications on file prior to visit.    Labs:  Lab Results  Component Value Date   HGBA1C 5.2 12/18/2021      Latest Ref Rng & Units 05/15/2023    9:33 AM 12/18/2021    9:40 AM 11/16/2021   12:29 PM  CMP  Glucose 70 - 99 mg/dL 93  89  604   BUN 6 - 24 mg/dL 11  12  12    Creatinine 0.57 - 1.00 mg/dL 5.40  9.81  1.91   Sodium 134 - 144 mmol/L 142  143  141   Potassium 3.5 - 5.2 mmol/L 4.7  4.6  5.3   Chloride 96 - 106 mmol/L 103  103  104   CO2 20 - 29 mmol/L 23  21  25    Calcium 8.7 - 10.2 mg/dL 9.3  9.7  9.4   Total Protein 6.0 - 8.5 g/dL 7.0  7.5  7.1   Total Bilirubin 0.0 - 1.2 mg/dL 0.4  0.3  0.4   Alkaline Phos 44 - 121 IU/L 122  129  120   AST 0 - 40 IU/L 16  17  16    ALT 0 - 32 IU/L 17  29  18     Lipid Panel      Component Value Date/Time   CHOL 195 05/15/2023 0933   TRIG 84 05/15/2023 0933   HDL 52 05/15/2023 0933   CHOLHDL 3.8 05/15/2023 0933   LDLCALC 128 (H) 05/15/2023 0933   LABVLDL 15 05/15/2023 0933    Notable Signs/Symptoms: None  Lifestyle & Dietary Hx Lives by herself. Eats most of meals out and cooks at home some  Estimated daily fluid intake: 40 oz Supplements:  Sleep: poor Stress / self-care: has some stress in her life Current average weekly physical activity: ADL  24-Hr Dietary Recall Eats 2-3 meals per day   Estimated Energy Needs Calories: 1200 Carbohydrate: 135g Protein: 90g Fat: 33g   NUTRITION DIAGNOSIS  NI-1.7 Predicted excessive energy intake As related to Inconsistent food intake and diet higher in processed foods.  As evidenced by BMI 35 and elevated BP readings while on medications..   NUTRITION INTERVENTION  Nutrition education (E-1) on the following topics:  Low Salt diet- alternatives for seasonings; impact  on BP and risks of HTN Lifestyle Medicine  - Whole Food, Plant Predominant Nutrition is highly recommended: Eat Plenty of vegetables, Mushrooms, fruits, Legumes, Whole Grains, Nuts, seeds in lieu of processed meats, processed snacks/pastries red meat, poultry, eggs.    -It is better to avoid simple carbohydrates including: Cakes, Sweet Desserts, Ice Cream, Soda (diet and regular), Sweet Tea, Candies, Chips, Cookies, Store Bought Juices, Alcohol in Excess of  1-2 drinks a day, Lemonade,  Artificial Sweeteners, Doughnuts, Coffee Creamers, "Sugar-free" Products, etc, etc.  This is not a complete list.....  Exercise: If you are able: 30 -60 minutes a day ,4 days a week, or 150 minutes a week.  The longer the better.  Combine stretch, strength, and aerobic activities.  If you were told in the past that you have high risk for cardiovascular diseases, you may seek evaluation by your heart doctor prior to initiating moderate to intense exercise  programs.    Handouts Provided Include  ***  Learning Style & Readiness for Change Teaching method utilized: Visual & Auditory  Demonstrated degree of understanding via: Teach Back  Barriers to learning/adherence to lifestyle change: ***  Goals Established by Pt ***   MONITORING & EVALUATION Dietary intake, weekly physical activity, and *** in ***.  Next Steps  Patient is to ***.

## 2023-07-08 ENCOUNTER — Encounter: Payer: Self-pay | Admitting: Nutrition

## 2023-09-03 ENCOUNTER — Ambulatory Visit: Payer: BC Managed Care – PPO | Admitting: Nutrition

## 2024-03-10 ENCOUNTER — Encounter: Payer: Self-pay | Admitting: Nurse Practitioner

## 2024-03-12 ENCOUNTER — Ambulatory Visit: Admitting: Nurse Practitioner

## 2024-03-12 ENCOUNTER — Encounter: Payer: Self-pay | Admitting: Nurse Practitioner

## 2024-03-12 VITALS — Temp 97.9°F | Ht 65.0 in | Wt 203.0 lb

## 2024-03-12 DIAGNOSIS — F17210 Nicotine dependence, cigarettes, uncomplicated: Secondary | ICD-10-CM

## 2024-03-12 DIAGNOSIS — I10 Essential (primary) hypertension: Secondary | ICD-10-CM | POA: Diagnosis not present

## 2024-03-12 DIAGNOSIS — F32A Depression, unspecified: Secondary | ICD-10-CM

## 2024-03-12 DIAGNOSIS — R5383 Other fatigue: Secondary | ICD-10-CM

## 2024-03-12 DIAGNOSIS — R0602 Shortness of breath: Secondary | ICD-10-CM

## 2024-03-12 DIAGNOSIS — R42 Dizziness and giddiness: Secondary | ICD-10-CM

## 2024-03-12 DIAGNOSIS — F419 Anxiety disorder, unspecified: Secondary | ICD-10-CM

## 2024-03-12 DIAGNOSIS — K219 Gastro-esophageal reflux disease without esophagitis: Secondary | ICD-10-CM

## 2024-03-12 MED ORDER — FLUOXETINE HCL 10 MG PO CAPS
10.0000 mg | ORAL_CAPSULE | Freq: Every day | ORAL | 0 refills | Status: DC
Start: 2024-03-12 — End: 2024-04-05

## 2024-03-12 NOTE — Progress Notes (Addendum)
 Subjective:    Patient ID: Doris Foster, female    DOB: [birth year], 43 y.o.   MRN: [id number]  HPI Discussed the use of AI scribe software for clinical note transcription with the patient, who gave verbal consent to proceed.  History of Present Illness Doris Foster is a 43 year old female who presents with dizziness, nausea, and fatigue.  She experiences dizziness and nausea primarily in the mornings, lasting from 7 AM until around 10 AM. These symptoms have been intermittent for months. She often experiences dry heaving while brushing her teeth and feels 'just a little bit weird' in her head. She does not eat until late in the day, which might contribute to her symptoms.  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 Her job involves significant physical activity, but she also spends time sitting between stops. She works long hours, often from 8 AM to 5:30 PM, and sometimes later, with additional desk work in the evenings. She also works a second job in [employer], [employer] on weekends, contributing to her fatigue and stress.  She has a history of low blood pressure readings, with recent measurements as low as 100/60 mmHg. She was previously on a low dose of losartan but has not been taking it regularly. She feels exhausted and suspects something else might be affecting her health. Denies suicidal or homicidal thoughts or ideation. Denies self harm behaviors.   She experiences anxiety attacks, particularly when stressed, and has a family history of heart problems, with her father having had a heart attack in his forties. She smokes about a pack a day and drinks alcohol socially. No drug use is reported.  She reports having regular periods every three weeks and suspects she might be perimenopausal. No contraceptives. Just had a normal cycle. States if she gets pregnant that is fine. She experiences shortness of breath, especially in the  heat or after consuming too much sugar.   She has a history of stomach problems, including nausea and gagging, particularly after eating certain foods like onions or green peppers. She experiences diarrhea more frequently than constipation and has hemorrhoids. No blood in stools. Minimal caffeine use.   She has had headaches since childhood, which have not changed significantly. She experiences some blurriness in her vision, especially when tired, and has difficulty swallowing at times, particularly with cold foods, which has been a long-standing issue.  Her female fiancee with her today. Defers being interviewed alone.    Review of Systems  Constitutional:  Positive for fatigue.  HENT:  Positive for trouble swallowing. Negative for sore throat.   Respiratory:  Negative for cough, chest tightness, shortness of breath and wheezing.         Shortness of breath mainly when working in the heat  Cardiovascular:  Negative for chest pain and leg swelling.  Gastrointestinal:  Positive for constipation, diarrhea and nausea. Negative for abdominal pain, blood in stool and vomiting.  Neurological:  Positive for light-headedness and headaches. Negative for syncope, facial asymmetry, speech difficulty and weakness.       No changes in chronic headaches      03/12/2024   11:24 AM  Depression screen PHQ 2/9  Decreased Interest 1  Down, Depressed, Hopeless 1  PHQ - 2 Score 2  Altered sleeping 2  Tired, decreased energy 3  Change in appetite 0  Feeling bad or failure about yourself  1  Trouble concentrating 1  Moving slowly or fidgety/restless 0  Suicidal thoughts 0  PHQ-9 Score 9  Difficult doing work/chores Very difficult      03/12/2024   11:24 AM 06/10/2023   11:28 AM 05/15/2023    8:47 AM 03/20/2023    9:45 AM  GAD 7 : Generalized Anxiety Score  Nervous, Anxious, on Edge 3 2 2 2   Control/stop worrying 3 1 1 1   Worry too much - different things 1 0 2 2  Trouble relaxing 2 1 1 2   Restless  0 0 0 0  Easily annoyed or irritable 2 2 3 1   Afraid - awful might happen 0 0 0 0  Total GAD 7 Score 11 6 9 8   Anxiety Difficulty Very difficult Somewhat difficult Very difficult Very difficult    Social History   Tobacco Use   Smoking status: Every Day    Current packs/day: 1.00    Average packs/day: 0.6 packs/day for 3.6 years (2.1 ttl pk-yrs)    Types: Cigarettes    Start date: 2025   Smokeless tobacco: Never  Vaping Use   Vaping status: Never Used  Substance Use Topics   Alcohol use: Yes    Comment: occas social   Drug use: No         Objective:   Physical Exam Vitals and nursing note reviewed.  Constitutional:      General: She is not in acute distress. Neck:     Comments: Thyroid non tender to palpation. No mass or goiter noted.  Cardiovascular:     Rate and Rhythm: Normal rate and regular rhythm.     Heart sounds: Normal heart sounds. No murmur heard. Pulmonary:     Effort: Pulmonary effort is normal.     Breath sounds: Normal breath sounds.  Abdominal:     General: There is no distension.     Palpations: Abdomen is soft. There is no mass.     Tenderness: There is no abdominal tenderness.  Musculoskeletal:     Right lower leg: No edema.     Left lower leg: No edema.  Neurological:     Mental Status: She is alert and oriented to person, place, and time.  Psychiatric:        Mood and Affect: Mood normal.        Behavior: Behavior normal.        Thought Content: Thought content normal.        Judgment: Judgment normal.     Comments: Making good eye contact. Speech clear. Mildly anxious affect.     EKG: NSR. Today's Vitals   03/12/24 1017  Temp: 97.9 F (36.6 C)  SpO2: 99%  Weight: 203 lb (92.1 kg)  Height: 5' 5 (1.651 m)   Body mass index is 33.78 kg/m.  Orthostatic Vitals for the past 48 hrs (Last 6 readings):  Patient Position Orthostatic BP Orthostatic Pulse BP Location Cuff Size  03/12/24 1017 -- 126/82 83 -- --  03/12/24 1102 Supine  124/85 66 Left Arm Large  03/12/24 1123 Sitting 132/90 82 Left Arm Large  03/12/24 1124 Standing (!) 135/95 73 Left Arm Large         Assessment & Plan:  Assessment and Plan Assessment & Plan Problem List Items Addressed This Visit       Cardiovascular and Mediastinum   Primary hypertension   Relevant Orders   EKG 12-Lead   Comprehensive metabolic panel with GFR   Lipid panel   Microalbumin / creatinine urine ratio  Other   Cigarette smoker   Other Visit Diagnoses       Anxiety and depression    -  Primary   Relevant Medications   FLUoxetine (PROZAC) 10 MG capsule     Shortness of breath       Relevant Orders   EKG 12-Lead     Lightheadedness       Relevant Orders   EKG 12-Lead     Fatigue, unspecified type       Relevant Orders   CBC with Differential/Platelet   Comprehensive metabolic panel with GFR   TSH      Meds ordered this encounter  Medications   FLUoxetine (PROZAC) 10 MG capsule    Sig: Take 1 capsule (10 mg total) by mouth daily.    Dispense:  30 capsule    Refill:  0    Supervising Provider:   ALPHONSA HAMILTON A [[id number]]    Anxiety and depression Experiencing significant stress and anxiety with symptoms likely related to anxiety. Considering daily medication emphasizing non-addictive options. - Prescribe Prozac 10 mg daily. - Monitor for side effects such as gastrointestinal upset or mild headache. DC med and contact office if any issues. - Schedule follow-up in one month to assess medication effectiveness and adjust dosage if necessary.  Tobacco use/Cigarette smoker Smokes approximately one pack per day. Smoking is a significant risk factor for low blood pressure and potential heart disease. - Encourage reduction in smoking to half a pack per day. - Reassess smoking habits at the next visit.  Gastroesophageal reflux disease with functional gastrointestinal symptoms (diarrhea, nausea, morning dry heaving) Experiences morning nausea and dry  heaving, possibly related to GERD. Stress and dietary habits may exacerbate symptoms. - Advise dietary modifications to avoid spicy and greasy foods. - Encourage smoking cessation to reduce reflux symptoms.  Primary hypertension/SOB/Lightheadedness Reports dizziness and nausea, possibly related to low blood pressure. Demanding work schedule and inadequate hydration may contribute. - Order basic lab work to check thyroid, kidney, and liver function. - Encourage increased fluid intake and regular meals. - Advise sitting down if feeling dizzy to prevent falls. -Discontinue Losartan  Return in about 1 month (around 04/12/2024) for video or in person.

## 2024-03-13 ENCOUNTER — Encounter: Payer: Self-pay | Admitting: Nurse Practitioner

## 2024-03-13 LAB — TSH: TSH: 0.911 u[IU]/mL (ref 0.450–4.500)

## 2024-03-13 LAB — CBC WITH DIFFERENTIAL/PLATELET
Basophils Absolute: 0 x10E3/uL (ref 0.0–0.2)
Basos: 0 %
EOS (ABSOLUTE): 0 x10E3/uL (ref 0.0–0.4)
Eos: 0 %
Hematocrit: 39.9 % (ref 34.0–46.6)
Hemoglobin: 13.3 g/dL (ref 11.1–15.9)
Immature Grans (Abs): 0 x10E3/uL (ref 0.0–0.1)
Immature Granulocytes: 0 %
Lymphocytes Absolute: 2.1 x10E3/uL (ref 0.7–3.1)
Lymphs: 27 %
MCH: 32.5 pg (ref 26.6–33.0)
MCHC: 33.3 g/dL (ref 31.5–35.7)
MCV: 98 fL — ABNORMAL HIGH (ref 79–97)
Monocytes Absolute: 0.5 x10E3/uL (ref 0.1–0.9)
Monocytes: 7 %
Neutrophils Absolute: 4.9 x10E3/uL (ref 1.4–7.0)
Neutrophils: 66 %
Platelets: 302 x10E3/uL (ref 150–450)
RBC: 4.09 x10E6/uL (ref 3.77–5.28)
RDW: 11.6 % — ABNORMAL LOW (ref 11.7–15.4)
WBC: 7.5 x10E3/uL (ref 3.4–10.8)

## 2024-03-13 LAB — MICROALBUMIN / CREATININE URINE RATIO
Creatinine, Urine: 193.6 mg/dL
Microalb/Creat Ratio: 4 mg/g{creat} (ref 0–29)
Microalbumin, Urine: 8.4 ug/mL

## 2024-03-13 LAB — LIPID PANEL
Chol/HDL Ratio: 4.1 ratio (ref 0.0–4.4)
Cholesterol, Total: 187 mg/dL (ref 100–199)
HDL: 46 mg/dL (ref >39)
LDL Chol Calc (NIH): 128 mg/dL — ABNORMAL HIGH (ref 0–99)
Triglycerides: 72 mg/dL (ref 0–149)
VLDL Cholesterol Cal: 13 mg/dL (ref 5–40)

## 2024-03-13 LAB — COMPREHENSIVE METABOLIC PANEL WITH GFR
ALT: 31 IU/L (ref 0–32)
AST: 25 IU/L (ref 0–40)
Albumin: 4.5 g/dL (ref 3.9–4.9)
Alkaline Phosphatase: 131 IU/L — ABNORMAL HIGH (ref 44–121)
BUN/Creatinine Ratio: 16 (ref 9–23)
BUN: 11 mg/dL (ref 6–24)
Bilirubin Total: 0.5 mg/dL (ref 0.0–1.2)
CO2: 26 mmol/L (ref 20–29)
Calcium: 9.9 mg/dL (ref 8.7–10.2)
Chloride: 105 mmol/L (ref 96–106)
Creatinine, Ser: 0.68 mg/dL (ref 0.57–1.00)
Globulin, Total: 2.8 g/dL (ref 1.5–4.5)
Glucose: 99 mg/dL (ref 70–99)
Potassium: 4.7 mmol/L (ref 3.5–5.2)
Sodium: 139 mmol/L (ref 134–144)
Total Protein: 7.3 g/dL (ref 6.0–8.5)
eGFR: 111 mL/min/1.73 (ref >59)

## 2024-03-15 ENCOUNTER — Ambulatory Visit: Payer: Self-pay | Admitting: Nurse Practitioner

## 2024-03-27 ENCOUNTER — Other Ambulatory Visit: Payer: Self-pay | Admitting: Nurse Practitioner

## 2024-03-30 ENCOUNTER — Other Ambulatory Visit: Payer: Self-pay

## 2024-03-30 MED ORDER — VALACYCLOVIR HCL 1 G PO TABS: ORAL_TABLET | ORAL | 3 refills | Status: AC

## 2024-04-03 ENCOUNTER — Other Ambulatory Visit: Payer: Self-pay | Admitting: Nurse Practitioner

## 2024-04-12 ENCOUNTER — Encounter: Payer: Self-pay | Admitting: Nurse Practitioner

## 2024-04-12 ENCOUNTER — Ambulatory Visit: Admitting: Nurse Practitioner

## 2024-04-12 VITALS — BP 133/87 | HR 58 | Ht 65.0 in | Wt 202.0 lb

## 2024-04-12 DIAGNOSIS — F419 Anxiety disorder, unspecified: Secondary | ICD-10-CM | POA: Diagnosis not present

## 2024-04-12 DIAGNOSIS — F32A Depression, unspecified: Secondary | ICD-10-CM | POA: Diagnosis not present

## 2024-04-12 DIAGNOSIS — W57XXXA Bitten or stung by nonvenomous insect and other nonvenomous arthropods, initial encounter: Secondary | ICD-10-CM

## 2024-04-12 DIAGNOSIS — Z23 Encounter for immunization: Secondary | ICD-10-CM | POA: Diagnosis not present

## 2024-04-12 DIAGNOSIS — S30860A Insect bite (nonvenomous) of lower back and pelvis, initial encounter: Secondary | ICD-10-CM

## 2024-04-12 DIAGNOSIS — R110 Nausea: Secondary | ICD-10-CM

## 2024-04-12 DIAGNOSIS — F17200 Nicotine dependence, unspecified, uncomplicated: Secondary | ICD-10-CM | POA: Insufficient documentation

## 2024-04-12 MED ORDER — ONDANSETRON 4 MG PO TBDP: 4.0000 mg | ORAL_TABLET | Freq: Three times a day (TID) | ORAL | 0 refills | Status: DC | PRN

## 2024-04-12 MED ORDER — TRIAMCINOLONE ACETONIDE 0.1 % EX CREA: 1.0000 | TOPICAL_CREAM | Freq: Two times a day (BID) | CUTANEOUS | 0 refills | Status: AC

## 2024-04-12 MED ORDER — FLUOXETINE HCL 20 MG PO CAPS: 20.0000 mg | ORAL_CAPSULE | Freq: Every day | ORAL | 0 refills | Status: DC

## 2024-04-12 NOTE — Progress Notes (Unsigned)
 Subjective:    Patient ID: Doris Foster, female    DOB: [birth year], 43 y.o.   MRN: [id number]  HPI CC: Patient arrived for a 1 month medication follow up  Patient began taking Prozac  10 mg every day last month due to symptoms of anxiety. She was and continues to experience nausea without vomiting in the morning after waking up and periodical crying episodes.  Patient reports a previous tick bite on her left lower back from months prior that continues to itch.  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  Respiratory:  Negative for cough, shortness of breath and wheezing.   Cardiovascular:  Negative for chest pain.  Gastrointestinal:  Positive for nausea. Negative for abdominal pain, constipation, diarrhea and vomiting.  Neurological:  Negative for headaches.  Psychiatric/Behavioral:  Negative for sleep disturbance and suicidal ideas. The patient is nervous/anxious.       03/12/2024   11:24 AM 06/10/2023   11:28 AM 05/15/2023    8:47 AM 03/20/2023    9:45 AM  GAD 7 : Generalized Anxiety Score  Nervous, Anxious, on Edge 3 2 2 2   Control/stop worrying 3 1 1 1   Worry too much - different things 1 0 2 2  Trouble relaxing 2 1 1 2   Restless 0 0 0 0  Easily annoyed or irritable 2 2 3 1   Afraid - awful might happen 0 0 0 0  Total GAD 7 Score 11 6 9 8   Anxiety Difficulty Very difficult Somewhat difficult Very difficult Very difficult        03/12/2024   11:24 AM 07/03/2023    8:19 AM 06/10/2023   11:27 AM  Depression screen PHQ 2/9  Decreased Interest 1 1 0  Down, Depressed, Hopeless 1 2 1   PHQ - 2 Score 2 3 1   Altered sleeping 2 0 0  Tired, decreased energy 3 3 1   Change  in appetite 0 3 0  Feeling bad or failure about yourself  1 0 1  Trouble concentrating 1 1 0  Moving slowly or fidgety/restless 0 0 0  Suicidal thoughts 0 0 0  PHQ-9 Score 9 10 3   Difficult doing work/chores Very difficult Somewhat difficult Somewhat difficult       Objective:   Physical Exam Constitutional:      Appearance: Normal appearance.  Cardiovascular:     Rate and Rhythm: Normal rate and regular rhythm.     Pulses: Normal pulses.     Heart sounds: Normal heart sounds.  Pulmonary:     Effort: Pulmonary effort is normal.     Breath sounds: Normal breath sounds.  Abdominal:     General: Abdomen is flat.     Palpations: Abdomen is soft.  Skin:    General: Skin is warm and dry.  Neurological:     Mental Status: She is alert.  Psychiatric:        Mood and Affect: Mood normal.    Vitals:   04/12/24 1305  BP: 133/87  Pulse: (!) 58  Height: 5' 5 (1.651 m)  Weight: 91.6 kg  BMI (Calculated): 33.61           Assessment & Plan:  Problem List Items Addressed This Visit   None Visit Diagnoses       Anxiety and depression    -  Primary   Relevant Medications   FLUoxetine  (PROZAC ) 20 MG capsule     Need for immunization against influenza       Relevant Orders   Flu vaccine trivalent PF, 6mos and older(Flulaval,Afluria,Fluarix,Fluzone) (Completed)     Nausea       Relevant Medications   ondansetron  (ZOFRAN -ODT) 4 MG disintegrating tablet     Tick bite of lower back, initial encounter       Relevant Medications   triamcinolone  cream (KENALOG ) 0.1 %       Return in about 2 months (around 06/12/2024).

## 2024-04-12 NOTE — Patient Instructions (Signed)
 Generic Pepcid

## 2024-04-13 ENCOUNTER — Encounter: Payer: Self-pay | Admitting: Nurse Practitioner

## 2024-04-13 DIAGNOSIS — R110 Nausea: Secondary | ICD-10-CM | POA: Insufficient documentation

## 2024-04-13 DIAGNOSIS — W57XXXA Bitten or stung by nonvenomous insect and other nonvenomous arthropods, initial encounter: Secondary | ICD-10-CM | POA: Insufficient documentation

## 2024-05-24 ENCOUNTER — Telehealth: Payer: Self-pay | Admitting: Family Medicine

## 2024-05-24 DIAGNOSIS — F419 Anxiety disorder, unspecified: Secondary | ICD-10-CM

## 2024-05-24 NOTE — Telephone Encounter (Unsigned)
 Copied from CRM [id number]. Topic: Clinical - Medication Refill >> May 24, 2024 10:55 AM Willma R wrote: Medication: FLUoxetine  (PROZAC ) 20 MG capsule  Has the patient contacted their pharmacy? Yes, call dr  This is the patient's preferred pharmacy:  [id number] - [place], [place] - [address] [place] [address] Phone: [phone] Fax: [phone]  Is this the correct pharmacy for this prescription? Yes If no, delete pharmacy and type the correct one.   Has the prescription been filled recently? No  Is the patient out of the medication? No  Has the patient been seen for an appointment in the last year OR does the patient have an upcoming appointment? Yes  Can we respond through MyChart? Yes  Agent: Please be advised that Rx refills may take up to 3 business days. We ask that you follow-up with your pharmacy.

## 2024-05-25 ENCOUNTER — Telehealth: Payer: Self-pay

## 2024-05-25 ENCOUNTER — Encounter: Payer: Self-pay | Admitting: Nurse Practitioner

## 2024-05-25 MED ORDER — FLUOXETINE HCL 20 MG PO CAPS: 20.0000 mg | ORAL_CAPSULE | Freq: Every day | ORAL | 0 refills | Status: DC

## 2024-05-25 NOTE — Telephone Encounter (Signed)
 My chart message sent

## 2024-05-25 NOTE — Telephone Encounter (Signed)
 Carolyn-you saw this patient back in [place] phone message stating that she is not telling much difference with fluoxetine -might need a stronger dose or counseling-given that you were more familiar with her situation in September-if you would handle this message that would be appreciated thank you I am forwarding this message to you-thanks-Caprina Wussow

## 2024-05-25 NOTE — Telephone Encounter (Signed)
 Copied from CRM [id number]. Topic: General - Other >> May 24, 2024 10:55 AM Doris Foster wrote: Reason for CRM: Patient calling to relay message in regards to the change in her FLUoxetine  (PROZAC ) 20 MG capsule. States she feels about the same, not any worse. Still has bad days, can't really tell a difference.   Patient can be reached at [phone]

## 2024-05-28 ENCOUNTER — Other Ambulatory Visit: Payer: Self-pay | Admitting: Nurse Practitioner

## 2024-05-28 MED ORDER — FLUOXETINE HCL 40 MG PO CAPS: 40.0000 mg | ORAL_CAPSULE | Freq: Every day | ORAL | 0 refills | Status: DC

## 2024-06-12 ENCOUNTER — Other Ambulatory Visit: Payer: Self-pay | Admitting: Nurse Practitioner

## 2024-06-12 DIAGNOSIS — R110 Nausea: Secondary | ICD-10-CM

## 2024-06-15 ENCOUNTER — Encounter: Payer: Self-pay | Admitting: Family Medicine

## 2024-06-15 ENCOUNTER — Ambulatory Visit (INDEPENDENT_AMBULATORY_CARE_PROVIDER_SITE_OTHER): Admitting: Family Medicine

## 2024-06-15 VITALS — BP 138/88 | HR 66 | Temp 97.7°F | Ht 65.0 in | Wt 198.5 lb

## 2024-06-15 DIAGNOSIS — Z0000 Encounter for general adult medical examination without abnormal findings: Secondary | ICD-10-CM

## 2024-06-15 DIAGNOSIS — Z0001 Encounter for general adult medical examination with abnormal findings: Secondary | ICD-10-CM | POA: Diagnosis not present

## 2024-06-15 DIAGNOSIS — F324 Major depressive disorder, single episode, in partial remission: Secondary | ICD-10-CM

## 2024-06-15 MED ORDER — FLUOXETINE HCL 40 MG PO CAPS: 40.0000 mg | ORAL_CAPSULE | Freq: Every day | ORAL | 1 refills | Status: AC

## 2024-06-15 MED ORDER — CLONAZEPAM 0.5 MG PO TABS: ORAL_TABLET | ORAL | 0 refills | Status: AC

## 2024-06-15 NOTE — Progress Notes (Signed)
 Subjective:    Patient ID: Doris Foster, female    DOB: Jun 18, 1981, 42 y.o.   MRN: [id number] Here for CPE. HPI The patient comes in today for a wellness visit.    A review of their health history was completed.  A review of medications was also completed.  Any needed refills; patient request refills  Eating habits: Tries to eat healthy for the most part  Falls/  MVA accidents in past few months: No accidents or injuries  Regular exercise: Stays physically active  Specialist pt sees on regular basis: None currently  Preventative health issues were discussed.   Additional concerns: Patient is on antidepressant to help with anxiety it is helping her moods are doing better she is requesting to have clonazepam for an occasional use in the evening when she feels stressed out and not able to sleep she is taking clonazepam in the past and denies abusing it      06/15/2024    1:09 PM 03/12/2024   11:24 AM 07/03/2023    8:19 AM 06/10/2023   11:27 AM 05/15/2023    8:46 AM  Depression screen PHQ 2/9  Decreased Interest 1 1 1  0 1  Down, Depressed, Hopeless 1 1 2 1 2   PHQ - 2 Score 2 2 3 1 3   Altered sleeping 1 2 0 0 0  Tired, decreased energy 1 3 3 1 1   Change in appetite 0 0 3 0 0  Feeling bad or failure about yourself  1 1 0 1 1  Trouble concentrating 2 1 1  0 1  Moving slowly or fidgety/restless 1 0 0 0 0  Suicidal thoughts 0 0 0 0 0  PHQ-9 Score 8 9  10  3  6    Difficult doing work/chores Very difficult Very difficult Somewhat difficult Somewhat difficult Somewhat difficult     Data saved with a previous flowsheet row definition      06/15/2024    1:09 PM 03/12/2024   11:24 AM 06/10/2023   11:28 AM 05/15/2023    8:47 AM  GAD 7 : Generalized Anxiety Score  Nervous, Anxious, on Edge 1 3 2 2   Control/stop worrying 1 3 1 1   Worry too much - different things 1 1 0 2  Trouble relaxing 1 2 1 1   Restless 1 0 0 0  Easily annoyed or irritable 1 2 2 3   Afraid - awful  might happen 1 0 0 0  Total GAD 7 Score 7 11 6 9   Anxiety Difficulty Very difficult Very difficult Somewhat difficult Very difficult      Review of Systems     Objective:   Physical Exam General-in no acute distress Eyes-no discharge Lungs-respiratory rate normal, CTA CV-no murmurs,RRR Extremities skin warm dry no edema Neuro grossly normal Behavior normal, alert        Assessment & Plan:  1. Well adult exam (Primary) Adult wellness-complete.wellness physical was conducted today. Importance of diet and exercise were discussed in detail.  Importance of stress reduction and healthy living were discussed.  In addition to this a discussion regarding safety was also covered.  We also reviewed over immunizations and gave recommendations regarding current immunization needed for age.   In addition to this additional areas were also touched on including: Preventative health exams needed:  Colonoscopy not indicated  Patient was advised yearly wellness exam Results for orders placed or performed in visit on 03/12/24  CBC with Differential/Platelet   Collection Time: 03/12/24 11:44 AM  Result Value Ref Range   WBC 7.5 3.4 - 10.8 x10E3/uL   RBC 4.09 3.77 - 5.28 x10E6/uL   Hemoglobin 13.3 11.1 - 15.9 g/dL   Hematocrit 60.0 65.9 - 46.6 %   MCV 98 (H) 79 - 97 fL   MCH 32.5 26.6 - 33.0 pg   MCHC 33.3 31.5 - 35.7 g/dL   RDW 88.3 (L) 88.2 - 84.5 %   Platelets 302 150 - 450 x10E3/uL   Neutrophils 66 Not Estab. %   Lymphs 27 Not Estab. %   Monocytes 7 Not Estab. %   Eos 0 Not Estab. %   Basos 0 Not Estab. %   Neutrophils Absolute 4.9 1.4 - 7.0 x10E3/uL   Lymphocytes Absolute 2.1 0.7 - 3.1 x10E3/uL   Monocytes Absolute 0.5 0.1 - 0.9 x10E3/uL   EOS (ABSOLUTE) 0.0 0.0 - 0.4 x10E3/uL   Basophils Absolute 0.0 0.0 - 0.2 x10E3/uL   Immature Granulocytes 0 Not Estab. %   Immature Grans (Abs) 0.0 0.0 - 0.1 x10E3/uL  Comprehensive metabolic panel with GFR   Collection Time: 03/12/24  11:44 AM  Result Value Ref Range   Glucose 99 70 - 99 mg/dL   BUN 11 6 - 24 mg/dL   Creatinine, Ser 9.31 0.57 - 1.00 mg/dL   eGFR 888 >40 fO/fpw/8.26   BUN/Creatinine Ratio 16 9 - 23   Sodium 139 134 - 144 mmol/L   Potassium 4.7 3.5 - 5.2 mmol/L   Chloride 105 96 - 106 mmol/L   CO2 26 20 - 29 mmol/L   Calcium 9.9 8.7 - 10.2 mg/dL   Total Protein 7.3 6.0 - 8.5 g/dL   Albumin 4.5 3.9 - 4.9 g/dL   Globulin, Total 2.8 1.5 - 4.5 g/dL   Bilirubin Total 0.5 0.0 - 1.2 mg/dL   Alkaline Phosphatase 131 (H) 44 - 121 IU/L   AST 25 0 - 40 IU/L   ALT 31 0 - 32 IU/L  Lipid panel   Collection Time: 03/12/24 11:44 AM  Result Value Ref Range   Cholesterol, Total 187 100 - 199 mg/dL   Triglycerides 72 0 - 149 mg/dL   HDL 46 >60 mg/dL   VLDL Cholesterol Cal 13 5 - 40 mg/dL   LDL Chol Calc (NIH) 871 (H) 0 - 99 mg/dL   Chol/HDL Ratio 4.1 0.0 - 4.4 ratio  TSH   Collection Time: 03/12/24 11:44 AM  Result Value Ref Range   TSH 0.911 0.450 - 4.500 uIU/mL  Microalbumin / creatinine urine ratio   Collection Time: 03/12/24 11:44 AM  Result Value Ref Range   Creatinine, Urine 193.6 Not Estab. mg/dL   Microalbumin, Urine 8.4 Not Estab. ug/mL   Microalb/Creat Ratio 4 0 - 29 mg/g creat   Labs reviewed with patient LDL is elevated medication not indicated currently  2. Depression, major, single episode, in partial remission Medication is helping continue current medication We will give small amount of clonazepam that he may use in the evening time when necessary to help with sleep Recommend follow-up with Doris Foster within 6 months follow-up sooner if any problems Patient not suicidal

## 2024-06-17 ENCOUNTER — Ambulatory Visit: Admitting: Nurse Practitioner

## 2024-12-07 ENCOUNTER — Ambulatory Visit: Admitting: Nurse Practitioner
